# Patient Record
Sex: Female | Born: 1985
Health system: Southern US, Community
[De-identification: ages and names within clinical notes are randomized; demographics above are authoritative.]

## PROBLEM LIST (undated history)

## (undated) DIAGNOSIS — T7840XA Allergy, unspecified, initial encounter: Secondary | ICD-10-CM

## (undated) DIAGNOSIS — J189 Pneumonia, unspecified organism: Secondary | ICD-10-CM

## (undated) DIAGNOSIS — D699 Hemorrhagic condition, unspecified: Secondary | ICD-10-CM

## (undated) DIAGNOSIS — Z8719 Personal history of other diseases of the digestive system: Secondary | ICD-10-CM

## (undated) DIAGNOSIS — D649 Anemia, unspecified: Secondary | ICD-10-CM

## (undated) HISTORY — DX: Anemia, unspecified: D64.9

## (undated) HISTORY — DX: Allergy, unspecified, initial encounter: T78.40XA

## (undated) HISTORY — DX: Personal history of other diseases of the digestive system: Z87.19

## (undated) HISTORY — DX: Hemorrhagic condition, unspecified: D69.9

## (undated) HISTORY — DX: Pneumonia, unspecified organism: J18.9

## (undated) HISTORY — PX: TONSILLECTOMY: SUR1361

## (undated) HISTORY — PX: WISDOM TOOTH EXTRACTION: SHX21

---
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
## 2011-02-22 ENCOUNTER — Ambulatory Visit (INDEPENDENT_AMBULATORY_CARE_PROVIDER_SITE_OTHER): Payer: BC Managed Care – PPO | Admitting: Family Medicine

## 2011-02-22 ENCOUNTER — Encounter: Payer: Self-pay | Admitting: Family Medicine

## 2011-02-22 DIAGNOSIS — D699 Hemorrhagic condition, unspecified: Secondary | ICD-10-CM

## 2011-02-22 DIAGNOSIS — Z711 Person with feared health complaint in whom no diagnosis is made: Secondary | ICD-10-CM

## 2011-02-22 NOTE — Progress Notes (Signed)
  Subjective:    Patient ID: Veronica Kane, female    DOB: December 17, 1985, 25 y.o.   MRN: [id number]  HPI Has just come off OC's, and thinks she ovulated.  Interested in getting pregnant.  Has h/o free bleeding after surgery and easy bruising. Has seen Hematologist in [facility] and had w/u for vWB and Vit. K def.  No diagnosis reached.  Does have some h/o of prolonged PTT. Reports painful cycle hx with assoc. Vomiting and concern for endometriosis.  Her mother had that diagnosis and had complete hyst. At age 57. She is not on folic acid at present.   Review of Systems Negative for fever, chills, nausea, vomiting, CP. SOB.  Has h/o migraine headaches with cycles.  Reports abdominal bloating and painful intercourse Since stopping her OC's.      Objective:   Physical Exam  Alert and oriented. VS reviewed HEENT-[place]/AT, sclera w/o icterus Neck-supple Lungs-nml resp. Effort CV-Reg Abdomen-soft, NT. Ext-no C/C/E Skin-no rash      Assessment & Plan:  Preconception counseling ? Bleeding diathesis Bloating of unclear etiology.

## 2011-02-22 NOTE — Patient Instructions (Signed)
Preparing for Pregnancy Preparing for pregnancy (preconceptual care) by getting counseling and information from your caregiver before getting pregnant is a good idea. It will help you and your baby have a better chance to have a healthy, safe pregnancy and delivery of your baby. Make an appointment with your caregiver to talk about your health, medical and family history and how to prepare yourself before getting pregnant. Your caregiver will do a complete physical exam and a Pap test. They will want to know:  About you, your spouse/partner and your family's medical and genetic history.   If you are eating a balanced diet and drinking enough fluids.   What vitamins and mineral supplements you are taking. This includes taking folic acid before getting pregnant to help prevent birth defects.   What medications you are taking including prescription, over-the-counter and herbal medications.   If there is any substance abuse like alcohol, smoking and illegal drugs.   If there is any mental or physical domestic violence.   If there is any risk of sexually transmitted disease between you and your partner.   What immunizations and vaccinations you have had and what you may need before getting pregnant.   If you should get tested for HIV infection.   If there is any exposure to chemical or toxic substances at home or work.   If there are medical problems you have that need to be treated and kept under control before getting pregnant such as diabetes, high blood pressure or others.   If there were any past surgeries, pregnancies and problems with them.   What your current weight is and to set a goal as to how much weight you should gain while pregnant. Also, they will check if you should lose or gain weight before getting pregnant.   What is your exercise routine and what it is safe when you are pregnant.   If there are any physical disabilities that need to be addressed.   About spacing your  pregnancies when there are other children.   If there is a financial problem that may affect you having a child.  After talking about the above points with your caregiver, your caregiver will give you advice on how to help treat and work with you on solving any issues, if necessary, before getting pregnant. The goal is to have a healthy and safe pregnancy for you and your baby. You should keep an accurate record of your menstrual periods because it will help in determining your due date. Immunizations that you should have before getting pregnant:   Regular measles, German measles (rubella) and mumps.  Tetanus and diphtheria.   Chicken pox, if not immune.   Herpes zoster (Varicella) if not immune.  Human papilloma virus vaccine (HPV) between the age of 9 and 26 years old).   Hepatitis A vaccine.  Hepatitis B vaccine.   Influenza vaccine.   Pneumococcal vaccine (pneumonia).   You should avoid getting pregnant for one month after getting vaccinated with a live virus vaccine such as German measles (rubella) vaccine. Other immunizations may be necessary depending on where you live, such as malaria. Ask your caregiver if any other immunizations are needed for you. HOME CARE INSTRUCTIONS  Follow the advice of your caregiver.   Before getting pregnant:   Begin taking vitamins, supplements and folic acid 0.4 milligrams/day.   Get your immunizations up to date.   Get help from a nutrition counselor if you do not understand what a balanced diet   is, need help with a special medical diet or if you need help to lose or gain weight.   Begin exercising.   Stop smoking, taking illegal drugs and drinking alcoholic beverages.   Get counseling if there is and type of domestic violence.   Get checked for sexually transmitted diseases including HIV.   Get any medical problems under control (diabetes, high blood pressure, convulsions, asthma or others).   Resolve any financial concerns.   Be  sure you and your spouse/partner are ready to have a baby.   Keep an accurate record of your menstrual periods.  Document Released: 05/19/2008  [employer] Patient Information 2011 [employer]. 

## 2011-05-18 ENCOUNTER — Ambulatory Visit: Payer: BC Managed Care – PPO | Admitting: Gynecology

## 2011-05-18 DIAGNOSIS — O3680X0 Pregnancy with inconclusive fetal viability, not applicable or unspecified: Secondary | ICD-10-CM

## 2011-05-18 DIAGNOSIS — Z3400 Encounter for supervision of normal first pregnancy, unspecified trimester: Secondary | ICD-10-CM

## 2011-05-19 LAB — OBSTETRIC PANEL
Basophils Absolute: 0 10*3/uL (ref 0.0–0.1)
Basophils Relative: 0 % (ref 0–1)
Eosinophils Absolute: 0 10*3/uL (ref 0.0–0.7)
Eosinophils Relative: 0 % (ref 0–5)
Hepatitis B Surface Ag: NEGATIVE
Lymphs Abs: 1.4 10*3/uL (ref 0.7–4.0)
MCH: 31.3 pg (ref 26.0–34.0)
Neutrophils Relative %: 74 % (ref 43–77)
Platelets: 251 10*3/uL (ref 150–400)
RBC: 4.54 MIL/uL (ref 3.87–5.11)
RDW: 12.7 % (ref 11.5–15.5)
WBC: 6.8 10*3/uL (ref 4.0–10.5)

## 2011-05-19 LAB — HIV ANTIBODY (ROUTINE TESTING W REFLEX): HIV: NONREACTIVE

## 2011-05-20 LAB — CULTURE, URINE COMPREHENSIVE
Colony Count: NO GROWTH
Organism ID, Bacteria: NO GROWTH

## 2011-05-23 ENCOUNTER — Other Ambulatory Visit: Payer: BC Managed Care – PPO

## 2011-05-23 NOTE — Progress Notes (Signed)
Patient is here today for heart beat check.  She had some very slight bleeding over the weekend after intercourse.  Positive fetal heart rate on bedside ultrasound and fetal pole measures 6 weeks and 6 days.  Patient is reassured and will follow up for her appointment with the physician as scheduled.

## 2011-05-25 ENCOUNTER — Ambulatory Visit: Payer: BC Managed Care – PPO | Admitting: Family Medicine

## 2011-05-25 ENCOUNTER — Encounter: Payer: Self-pay | Admitting: Family Medicine

## 2011-05-25 DIAGNOSIS — O209 Hemorrhage in early pregnancy, unspecified: Secondary | ICD-10-CM | POA: Insufficient documentation

## 2011-05-25 DIAGNOSIS — D699 Hemorrhagic condition, unspecified: Secondary | ICD-10-CM | POA: Insufficient documentation

## 2011-05-25 DIAGNOSIS — Z3400 Encounter for supervision of normal first pregnancy, unspecified trimester: Secondary | ICD-10-CM | POA: Insufficient documentation

## 2011-05-25 NOTE — Patient Instructions (Signed)
Threatened Miscarriage  Bleeding during the first 20 weeks of pregnancy is common. This is sometimes called a threatened miscarriage. This is a pregnancy that is threatening to end before the twentieth week of pregnancy. Often this bleeding stops with bed rest or decreased activities as suggested by your caregiver and the pregnancy continues without any more problems. You may be asked to not have sexual intercourse, have orgasms or use tampons until further notice. Sometimes a threatened miscarriage can progress to a complete or incomplete miscarriage. This may or may not require further treatment. Some miscarriages occur before a woman misses a menstrual period and knows she is pregnant.  Miscarriages occur in 15 to 20% of all pregnancies and usually occur during the first 13 weeks of the pregnancy. The exact cause of a miscarriage is usually never known. A miscarriage is natures way of ending a pregnancy that is abnormal or would not make it to term. There are some things that may put you at risk to have a miscarriage, such as:   Hormone problems.   Infection of the uterus or cervix.   Chronic illness, diabetes for example, especially if it is not controlled.   Abnormal shaped uterus.   Fibroids in the uterus.   Incompetent cervix (the cervix is too weak to hold the baby).   Smoking.   Drinking too much alcohol. It's best not to drink any alcohol when you are pregnant.   Taking illegal drugs.  TREATMENT   When a miscarriage becomes complete and all products of conception (all the tissue in the uterus) have been passed, often no treatment is needed. If you think you passed tissue, save it in a container and take it to your doctor for evaluation. If the miscarriage is incomplete (parts of the fetus or placenta remain in the uterus), further treatment may be needed. The most common reason for further treatment is continued bleeding (hemorrhage) because pregnancy tissue did not pass out of the uterus. This  often occurs if a miscarriage is incomplete. Tissue left behind may also become infected. Treatment usually is dilatation and curettage (the removal of the remaining products of pregnancy. This can be done by a simple sucking procedure (suction curettage) or a simple scraping of the inside of the uterus. This may be done in the hospital or in the caregiver's office. This is only done when your caregiver knows that there is no chance for the pregnancy to proceed to term. This is determined by physical examination, negative pregnancy test, falling pregnancy hormone count and/or, an ultrasound revealing a dead fetus.  Miscarriages are often a very emotional time for prospective mothers and fathers. This is not you or your partners fault. It did not occur because of an inadequacy in you or your partner. Nearly all miscarriages occur because the pregnancy has started off wrongly. At least half of these pregnancies have a chromosomal abnormality. It is almost always not inherited. Others may have developmental problems with the fetus or placenta. This does not always show up even when the products miscarried are studied under the microscope. The miscarriage is nearly always not your fault and it is not likely that you could have prevented it from happening. If you are having emotional and grieving problems, talk to your health care provider and even seek counseling, if necessary, before getting pregnant again. You can begin trying for another pregnancy as soon as your caregiver says it is OK.  HOME CARE INSTRUCTIONS    Your caregiver may order   and cramping you are having. You may be limited to only getting up to go to the bathroom. You may be allowed to continue light activity. You may need to make arrangements for the care of your other children and for any other responsibilities.   Keep track of the number of pads you use each day, how often you have to change pads  and how saturated (soaked) they are. Record this information.   DO NOT USE TAMPONS. Do not douche, have sexual intercourse or orgasms until approved by your caregiver.   You may receive a follow up appointment for re-evaluation of your pregnancy and a repeat blood test. Re-evaluation often occurs after 2 days and again in 4 to 6 weeks. It is very important that you follow-up in the recommended time period.   If you are Rh negative and the father is Rh positive or you do not know the fathers' blood type, you may receive a shot (Rh immune globulin) to help prevent abnormal antibodies that can develop and affect the baby in any future pregnancies.  SEEK IMMEDIATE MEDICAL CARE IF:  You have severe cramps in your stomach, back, or abdomen.   You have a sudden onset of severe pain in the lower part of your abdomen.   You develop chills.   You run an unexplained temperature of 101 F (38.3 C) or higher.   You pass large clots or tissue. Save any tissue for your caregiver to inspect.   Your bleeding increases or you become light-headed, weak, or have fainting episodes.   You have a gush of fluid from your vagina.   You pass out. This could mean you have a tubal (ectopic) pregnancy.  Document Released: 06/06/2005 Document Revised: 02/16/2011 Document Reviewed: 01/21/2008 [facility] Patient Information 2012 [place], [place].

## 2011-05-25 NOTE — Progress Notes (Signed)
Patient is here today because she has been having spotting.  She is also taking DHA supplament and has a "tendency toward [place]"  She had heard that DHA might not be good to take with this disorder.  She is very concerned about the increase in spotting.  We did see a positive fetal heart rate on 11/28 and 12/5.  She also has a little cramping now which is new as of today.  She is also feeling increased fatigue over the past two days as well.  Her urine dip was wnl except for blood in urine.

## 2011-05-25 NOTE — Progress Notes (Signed)
Here today for bleeding during pregnancy.  Is having bleeding wonders if it is related to DHA.  She was worked in.  TVUS reveals a single IUP with nml FHR, yolk sac, CRL = 7w 3d. Threatened AB discussed with pt.  Will need pap/pelvic full new OB visit at next visit.

## 2011-05-27 ENCOUNTER — Other Ambulatory Visit: Payer: BC Managed Care – PPO

## 2011-05-27 NOTE — Progress Notes (Signed)
Patient comes today to confirm fetal heart rate due to continued bleeding that she is experiencing.  Bedside ultrasound shows proper growth and positive fetal heart rate.  She is reassured and will go to [facility] if her symptoms change or worsen. She will keep her regular scheduled appointment.

## 2011-06-01 ENCOUNTER — Other Ambulatory Visit: Payer: BC Managed Care – PPO | Admitting: *Deleted

## 2011-06-01 NOTE — Progress Notes (Signed)
Patient is here today for heartbeat check.  Positive fetal heart rate on ultrasound.  Patient is reassured/tn

## 2011-06-22 ENCOUNTER — Ambulatory Visit (HOSPITAL_COMMUNITY): Payer: BC Managed Care – PPO

## 2011-06-27 ENCOUNTER — Encounter: Payer: BC Managed Care – PPO | Admitting: Obstetrics & Gynecology

## 2011-12-13 ENCOUNTER — Encounter: Payer: Self-pay | Admitting: Family Medicine

## 2011-12-26 ENCOUNTER — Telehealth (HOSPITAL_COMMUNITY): Payer: Self-pay | Admitting: *Deleted

## 2011-12-26 NOTE — Telephone Encounter (Signed)
Preadmission screen  

## 2014-04-21 ENCOUNTER — Encounter: Payer: Self-pay | Admitting: Family Medicine

## 2015-01-01 ENCOUNTER — Ambulatory Visit (INDEPENDENT_AMBULATORY_CARE_PROVIDER_SITE_OTHER): Payer: BLUE CROSS/BLUE SHIELD | Admitting: Family Medicine

## 2015-01-01 ENCOUNTER — Encounter (INDEPENDENT_AMBULATORY_CARE_PROVIDER_SITE_OTHER): Payer: Self-pay

## 2015-01-01 ENCOUNTER — Encounter: Payer: Self-pay | Admitting: Family Medicine

## 2015-01-01 VITALS — BP 108/60 | HR 74 | Temp 97.8°F | Ht 64.0 in | Wt 132.5 lb

## 2015-01-01 DIAGNOSIS — D689 Coagulation defect, unspecified: Secondary | ICD-10-CM

## 2015-01-01 DIAGNOSIS — J3089 Other allergic rhinitis: Secondary | ICD-10-CM

## 2015-01-01 DIAGNOSIS — J309 Allergic rhinitis, unspecified: Secondary | ICD-10-CM | POA: Insufficient documentation

## 2015-01-01 DIAGNOSIS — D699 Hemorrhagic condition, unspecified: Secondary | ICD-10-CM

## 2015-01-01 NOTE — Patient Instructions (Addendum)
Scheduled CPX, pap no labs prior in next few months.

## 2015-01-01 NOTE — Progress Notes (Signed)
   Subjective:    Patient ID: Veronica Kane, female    DOB: Nov 05, 1985, 29 y.o.   MRN: [id number]  HPI  29 year old female presents to establish care. Last CPX 3 years ago, due for pap now. Had baby four months ago with GYN. She would like to do this here.   At age 38 was tested for bleeding disorder after large contusion. Irregular PTT.. No specific issues noted per hematologist but likely has some mild bleeding disorder. Told should be no issue except avoid aspirin. She did have post delivery bledeing but likely due to given motrin.  No hemorrhage with second child.  Exercise: walks every few days Diet: Healthy, fruits and veggies, water  Review of Systems  Constitutional: Negative for fever and fatigue.  HENT: Negative for congestion.   Eyes: Negative for pain.  Respiratory: Negative for cough and shortness of breath.   Cardiovascular: Negative for chest pain, palpitations and leg swelling.  Gastrointestinal: Negative for abdominal pain.  Genitourinary: Negative for dysuria and vaginal bleeding.  Neurological: Negative for syncope, light-headedness and headaches.  Psychiatric/Behavioral: Negative for dysphoric mood.       Objective:   Physical Exam  Constitutional: Vital signs are normal. She appears well-developed and well-nourished. She is cooperative.  Non-toxic appearance. She does not appear ill. No distress.  HENT:  Head: Normocephalic.  Right Ear: Hearing, tympanic membrane, external ear and ear canal normal.  Left Ear: Hearing, tympanic membrane, external ear and ear canal normal.  Nose: Nose normal.  Eyes: Conjunctivae, EOM and lids are normal. Pupils are equal, round, and reactive to light. Lids are everted and swept, no foreign bodies found.  Neck: Trachea normal and normal range of motion. Neck supple. Carotid bruit is not present. No thyroid mass and no thyromegaly present.  Cardiovascular: Normal rate, regular rhythm, S1 normal, S2 normal, normal heart  sounds and intact distal pulses.  Exam reveals no gallop.   No murmur heard. Pulmonary/Chest: Effort normal and breath sounds normal. No respiratory distress. She has no wheezes. She has no rhonchi. She has no rales.  Abdominal: Soft. Normal appearance and bowel sounds are normal. She exhibits no distension, no fluid wave, no abdominal bruit and no mass. There is no hepatosplenomegaly. There is no tenderness. There is no rebound, no guarding and no CVA tenderness. No hernia.  Lymphadenopathy:    She has no cervical adenopathy.    She has no axillary adenopathy.  Neurological: She is alert. She has normal strength. No cranial nerve deficit or sensory deficit.  Skin: Skin is warm, dry and intact. No rash noted.  Psychiatric: Her speech is normal and behavior is normal. Judgment normal. Her mood appears not anxious. Cognition and memory are normal. She does not exhibit a depressed mood.          Assessment & Plan:

## 2015-01-01 NOTE — Progress Notes (Signed)
Pre visit review using our clinic review tool, if applicable. No additional management support is needed unless otherwise documented below in the visit note. 

## 2015-01-01 NOTE — Assessment & Plan Note (Signed)
No ASA no NSAIDs.

## 2015-01-01 NOTE — Assessment & Plan Note (Signed)
Stable control on prn meds.

## 2015-07-14 ENCOUNTER — Ambulatory Visit (INDEPENDENT_AMBULATORY_CARE_PROVIDER_SITE_OTHER): Payer: BLUE CROSS/BLUE SHIELD | Admitting: Internal Medicine

## 2015-07-14 ENCOUNTER — Encounter: Payer: Self-pay | Admitting: Internal Medicine

## 2015-07-14 ENCOUNTER — Ambulatory Visit (INDEPENDENT_AMBULATORY_CARE_PROVIDER_SITE_OTHER)
Admission: RE | Admit: 2015-07-14 | Discharge: 2015-07-14 | Disposition: A | Discharge status: Home / Self Care | Payer: BLUE CROSS/BLUE SHIELD | Source: Ambulatory Visit | Attending: Internal Medicine | Admitting: Internal Medicine

## 2015-07-14 VITALS — BP 102/68 | HR 71 | Temp 99.0°F | Wt 122.0 lb

## 2015-07-14 DIAGNOSIS — S99921A Unspecified injury of right foot, initial encounter: Secondary | ICD-10-CM | POA: Diagnosis not present

## 2015-07-14 NOTE — Progress Notes (Signed)
Pre visit review using our clinic review tool, if applicable. No additional management support is needed unless otherwise documented below in the visit note. 

## 2015-07-14 NOTE — Patient Instructions (Signed)
Toe Fracture With Rehab  A fracture is a break in the bone that can be either partial or complete. Fractures of the toe bones may or may not include the joints that separate the bones.  SYMPTOMS   · Severe pain over the fracture site at the time of injury that may persist for an extend period of time.  · Pain, tenderness, inflammation, and/or bruising (contusion) over the fracture site.  · Visible deformity, if the bone fragments are not properly aligned (displaced fracture).  · Signs of vascular damage: numbness or coldness (uncommon).  CAUSES   Toe fractures occur when a force is placed on the bone that is greater than it can withstand.  · Direct hit (trauma) to the toe.  · Indirect trauma to the toe, such as forcefully pivoting on a planted foot.  RISK INCREASES WITH:  · Performing activities barefoot (i.e. ballet, gymnastics).  · Wearing shoes with little support or protection.  · Sports with cleats (i.e. football, rugby, lacrosse, soccer).  · Bone disease (i.e. osteoporosis, bone tumors).  PREVENTION   · Wear properly fitted and protective shoes.  · Protect previously injured toes with tape or padding.  PROGNOSIS   If treated properly, toe fractures usually heal within 4 to 6 weeks.  RELATED COMPLICATIONS   · Failure of the fracture to heal (nonunion).  · Healing of the fracture in a poor position (malunion).  · Recurring symptoms.  · Recurring symptoms that result in a chronic problem.  · Excessive bleeding, causing pressure on nerves and blood vessels (rare).  · Arthritis of the affected joints.  · Stopping of bone growth in children.  · Infection in fractures where the skin is broken over the fracture (open fracture).  · Shortening of injured bones.  TREATMENT   Treatment first involves the use of ice and medicine to reduce pain and inflammation. The toe should be restrained for a period of time to allow for healing, usually about 4 weeks. Your caregiver may advise wearing a hard-soled shoe to minimize  stress on the healing bone. Surgery is uncommon for this injury, but may be necessary if the fracture is severely displaced or if the bone pushes through the skin. Surgery typically involves the use of screws, pins, and/or plates to hold the fracture in place. After surgery, restraint of the foot is necessary.  MEDICATION   · If pain medicine is necessary, nonsteroidal anti-inflammatory medications (aspirin and ibuprofen), or other minor pain relievers (acetaminophen), are often recommended.  · Do not take pain medicine for 7 days before surgery.  · Prescription pain relievers may be given if your caregiver thinks they are needed. Use only as directed and only as much as you need.  COLD THERAPY   Cold treatment (icing) relieves pain and reduces inflammation. Cold treatment should be applied for 10 to 15 minutes every 2 to 3 hours, and immediately after activity that aggravates your symptoms. Use ice packs or an ice massage.  SEEK MEDICAL CARE IF:   · Treatment does not seem to help, or the condition gets worse.  · Any medicines produce negative side effects.  · Any complications from surgery occur:    Pain, numbness, or coldness in the affected foot.    Discoloration beneath the toenails (blue or gray) of the affected foot.    Signs of infection (fever, pain, inflammation, redness, or persistent bleeding).  EXERCISES  RANGE OF MOTION (ROM) AND STRETCHING EXERCISES - Toe Fracture (Phalangeal)  These exercises may   help you when beginning to rehabilitate your injury. Your symptoms may resolve with or without further involvement from your physician, physical therapist or athletic trainer. While completing these exercises, remember:   · Restoring tissue flexibility helps normal motion to return to the joints. This allows healthier, less painful movement and activity.  · An effective stretch should be held for at least 30 seconds.  · A stretch should never be painful. You should only feel a gentle lengthening or release  in the stretched tissue.  RANGE OF MOTION - Dorsi/Plantar Flexion  · While sitting with your right / left knee straight, draw the top of your foot upwards by flexing your ankle. Then reverse the motion, pointing your toes downward.  · Hold each position for __________ seconds.  · After completing your first set of exercises, repeat this exercise with your knee bent.  Repeat __________ times. Complete this exercise __________ times per day.   RANGE OF MOTION - Ankle Alphabet  Imagine your right / left big toe is a pen.  Keeping your hip and knee still, write out the entire alphabet with your "pen." Make the letters as large as you can without increasing any discomfort.  Repeat __________ times. Complete this exercise __________ times per day.   RANGE OF MOTION - Toe Extension, Flexion  · Sit with your right / left leg crossed over your opposite knee.  · Grasp your toes and gently pull them back toward the top of your foot. You should feel a stretch on the bottom of your toes and foot.  · Hold this stretch for __________ seconds.  · Now, gently pull your toes toward the bottom of your foot. You should feel a stretch on the top of your toes and foot.  · Hold this stretch for __________ seconds.  Repeat __________ times. Complete this stretch__________ times per day.   STRENGTHENING EXERCISES - Toe Fracture (Phalangeal)  These exercises may help you when beginning to rehabilitate your injury. They may resolve your symptoms with or without further involvement from your physician, physical therapist or athletic trainer. While completing these exercises, remember:   · Muscles can gain both the endurance and the strength needed for everyday activities through controlled exercises.  · Complete these exercises as instructed by your physician, physical therapist or athletic trainer. Increase the resistance and repetitions only as guided.  · You may experience muscle soreness or fatigue, but the pain or discomfort you are  trying to eliminate should never worsen during these exercises. If this pain does get worse, stop and make sure you are following the directions exactly. If the pain is still present after adjustments, discontinue the exercise until you can discuss the trouble with your clinician.  STRENGTH - Towel Curls  · Sit in a chair, on a non-carpeted surface.  · Place your foot on a towel, keeping your heel on the floor.  · Pull the towel toward your heel only by curling your toes. Keep your heel on the floor.  · If instructed by your physician, physical therapist or athletic trainer, add ____________________ at the end of the towel.  Repeat __________ times. Complete this exercise __________ times per day.     This information is not intended to replace advice given to you by your health care provider. Make sure you discuss any questions you have with your health care provider.     Document Released: 06/06/2005 Document Revised: 10/21/2014 Document Reviewed: 09/18/2008  [employer] Interactive Patient Education ©2016 [employer]

## 2015-07-14 NOTE — Progress Notes (Signed)
Subjective:    Patient ID: Veronica Kane, female    DOB: September 28, 1985, 28 y.o.   MRN: [id number]  HPI   Pt presents to the clinic today with c/o right great toe injury. This occurred last night. She dropped a large stainless steel pot on the base of the toe. The pain is constantly achy, and severe with movement or to touch. The pain extends midway up dorsal aspect of right foot. She has noticed a lump at the base of her great toe. She has not tried OTC pain relievers or ice. Patient is breastfeeding and states she can deal with the pain rather than taking medication.    Review of Systems  Past Medical History  Diagnosis Date  . Allergy     Current Outpatient Prescriptions  Medication Sig Dispense Refill  . HEATHER 0.35 MG tablet Take 1 tablet by mouth daily.      No current facility-administered medications for this visit.    Allergies  Allergen Reactions  . Aspirin Other (See Comments)    Pt ha hx of anemia.  Marland Kitchen Penicillins Hives    Family History  Problem Relation Age of Onset  . Heart disease Father   . Hypertension Father   . Alcohol abuse Maternal Grandfather   . Alcohol abuse Paternal Grandfather     Social History   Social History  . Marital Status: Unknown    Spouse Name: N/A  . Number of Children: N/A  . Years of Education: N/A   Occupational History  . Not on file.   Social History Main Topics  . Smoking status: Never Smoker   . Smokeless tobacco: Never Used  . Alcohol Use: 0.0 oz/week    0 Standard drinks or equivalent per week     Comment: Occasional  . Drug Use: No  . Sexual Activity: Yes    Birth Control/ Protection: Pill   Other Topics Concern  . Not on file   Social History Narrative    Skin: Positive for bruising and swelling at the base of the right great toe.  Musculoskeletal: Positive for pain at the base of the right great toe extending midway up dorsal aspect of her right foot. Denies difficulty with gait or muscle  pain. Neurological: Denies loss of sensation in right great toe and right foot.    No other specific complaints in a complete review of systems (except as listed in HPI above).     Objective:   Physical Exam  BP 102/68 mmHg  Pulse 71  Temp(Src) 99 F (37.2 C) (Oral)  Wt 122 lb (55.339 kg)  SpO2 98% Wt Readings from Last 3 Encounters:  07/14/15 122 lb (55.339 kg)  01/01/15 132 lb 8 oz (60.102 kg)    General: Appears her stated age,  in NAD. Skin: Warm, dry and intact. Bruising over base of right great toe and extending midway up right dorsal side of foot.  Cardio: Dorsalis pedis pulse 2+, right LE.  Musculoskeletal: Pain to palpation in right great toe extending up foot past proximal intertarsal joint line. Decreased AROM in right great toe, normal PROM with pain and greatly increased pain with any lateral movement of great toe. Mild swelling over first proximal intertarsal joint. No difficulty with gait.  Neurological: Sensation in tact in right foot and toes.        Assessment & Plan:  Right great toe injury:  Xray of right great toe ordered Pt instructed to use ice for pain PRN Buddy taped  toe for comfort Will call patient with radiology results Suggest post-op shoe if fracture in great toe, referral to ortho if fracture in tarsal bone  RTC as needed, or if pain worsens/does not get better as anticipated

## 2015-12-11 ENCOUNTER — Other Ambulatory Visit: Payer: Self-pay

## 2015-12-11 MED ORDER — HEATHER 0.35 MG PO TABS: 1.0000 | ORAL_TABLET | Freq: Every day | ORAL | Status: DC

## 2015-12-11 MED ORDER — NORETHINDRONE 0.35 MG PO TABS: 1.0000 | ORAL_TABLET | Freq: Every day | ORAL | Status: DC

## 2015-12-11 NOTE — Telephone Encounter (Signed)
Veronica Kane notified 1 pack of birth control pills have been sent to her pharmacy.  Reminded her that she will need to schedule CPE with Dr. Diona Browner prior to any additional refills.

## 2015-12-11 NOTE — Telephone Encounter (Signed)
Pt established care 01/01/15; pt has not been seen for CPX yet; pt has not received BC pill from [facility] (now pt thinks she is out of refills). Med prescribed by OB GYN at [facility] March or April of 2016 at 6 wk check up after delivery of child. Pt needs to start [facility] pill today. Wants to know if [facility] can be refilled until can schedule appt with Dr Diona Browner for CPX. Advised pt Dr Diona Browner is out of office but will send note; also suggested to pt to ck with prescribing physician to see if will give refill on BC pill. Pt request cb. [facility].

## 2015-12-11 NOTE — Addendum Note (Signed)
Addended by: Carter Kitten on: 12/11/2015 04:31 PM   Modules accepted: Orders

## 2015-12-11 NOTE — Telephone Encounter (Signed)
Generic Rx sent electronically.

## 2015-12-11 NOTE — Telephone Encounter (Signed)
Veronica Kane with [facility] left v/m; does not have namebrand Veronica Kane 0.35 mg but [facility] same generic active hormone with no estrogen in it. Veronica Kane has spoken with pt and pt is OK with changing from [employer]. Veronica Kane request cb or can send electronically.

## 2015-12-31 ENCOUNTER — Other Ambulatory Visit: Payer: Self-pay

## 2015-12-31 ENCOUNTER — Ambulatory Visit (INDEPENDENT_AMBULATORY_CARE_PROVIDER_SITE_OTHER): Payer: BLUE CROSS/BLUE SHIELD | Admitting: Family Medicine

## 2015-12-31 ENCOUNTER — Other Ambulatory Visit (HOSPITAL_COMMUNITY)
Admission: RE | Admit: 2015-12-31 | Discharge: 2015-12-31 | Disposition: A | Discharge status: Home / Self Care | Payer: BLUE CROSS/BLUE SHIELD | Source: Ambulatory Visit | Attending: Family Medicine | Admitting: Family Medicine

## 2015-12-31 ENCOUNTER — Encounter: Payer: Self-pay | Admitting: Family Medicine

## 2015-12-31 VITALS — BP 100/52 | HR 84 | Ht 63.5 in | Wt 124.8 lb

## 2015-12-31 DIAGNOSIS — Z0000 Encounter for general adult medical examination without abnormal findings: Secondary | ICD-10-CM | POA: Diagnosis not present

## 2015-12-31 DIAGNOSIS — Z1151 Encounter for screening for human papillomavirus (HPV): Secondary | ICD-10-CM | POA: Diagnosis present

## 2015-12-31 DIAGNOSIS — Z01419 Encounter for gynecological examination (general) (routine) without abnormal findings: Secondary | ICD-10-CM | POA: Insufficient documentation

## 2015-12-31 MED ORDER — NORETHINDRONE 0.35 MG PO TABS: 1.0000 | ORAL_TABLET | Freq: Every day | ORAL | Status: DC

## 2015-12-31 NOTE — Progress Notes (Signed)
Subjective:    Patient ID: Veronica Kane, female    DOB: 08/24/1985, 30 y.o.   MRN: [id number]  HPI   The patient is here for annual wellness exam and preventative care.    She feels well overall. She has not had a menses since prior to having her child. 37 month old. She is breast feeding. She is using progesterone pill. No PMS, no breast ache, no cramping  As if she is going to have menses.      She has been feeling lightheaded today, some tired lately. She drinks 4-5 bottles a day.  No syyncope BP Readings from Last 3 Encounters:  12/31/15 100/52  07/14/15 102/68  01/01/15 108/60     At age 53 was tested for bleeding disorder after large contusion. Irregular PTT.. No specific issues noted per hematologist but likely has some mild bleeding disorder. Told should be no issue except avoid aspirin. She did have post delivery bledeing but likely due to given motrin. No hemorrhage with second child.  Exercise: walks every few days Diet: Healthy, fruits and veggies, water  Social History /Family History/Past Medical History reviewed and updated if needed.  Review of Systems  Constitutional: Negative for fever and fatigue.  HENT: Negative for congestion and ear pain.   Eyes: Negative for pain.  Respiratory: Negative for cough, chest tightness and shortness of breath.   Cardiovascular: Negative for chest pain, palpitations and leg swelling.  Gastrointestinal: Negative for abdominal pain.  Genitourinary: Negative for dysuria and vaginal bleeding.  Musculoskeletal: Positive for back pain.  Neurological: Positive for dizziness. Negative for syncope, light-headedness and headaches.  Psychiatric/Behavioral: Negative for dysphoric mood.       Objective:   Physical Exam  Constitutional: Vital signs are normal. She appears well-developed and well-nourished. She is cooperative.  Non-toxic appearance. She does not appear ill. No distress.  HENT:  Head:  Normocephalic.  Right Ear: Hearing, tympanic membrane, external ear and ear canal normal.  Left Ear: Hearing, tympanic membrane, external ear and ear canal normal.  Nose: Nose normal.  Eyes: Conjunctivae, EOM and lids are normal. Pupils are equal, round, and reactive to light. Lids are everted and swept, no foreign bodies found.  Neck: Trachea normal and normal range of motion. Neck supple. Carotid bruit is not present. No thyroid mass and no thyromegaly present.  Cardiovascular: Normal rate, regular rhythm, S1 normal, S2 normal, normal heart sounds and intact distal pulses.  Exam reveals no gallop.   No murmur heard. Pulmonary/Chest: Effort normal and breath sounds normal. No respiratory distress. She has no wheezes. She has no rhonchi. She has no rales.  Abdominal: Soft. Normal appearance and bowel sounds are normal. She exhibits no distension, no fluid wave, no abdominal bruit and no mass. There is no hepatosplenomegaly. There is no tenderness. There is no rebound, no guarding and no CVA tenderness. No hernia.  Genitourinary: Vagina normal and uterus normal. No breast swelling, tenderness, discharge or bleeding. Pelvic exam was performed with patient supine. There is no rash, tenderness or lesion on the right labia. There is no rash, tenderness or lesion on the left labia. Uterus is not enlarged and not tender. Cervix exhibits no motion tenderness, no discharge and no friability. Right adnexum displays no mass, no tenderness and no fullness. Left adnexum displays no mass, no tenderness and no fullness.  Lymphadenopathy:    She has no cervical adenopathy.    She has no axillary adenopathy.  Neurological: She is alert. She has normal  strength. No cranial nerve deficit or sensory deficit.  Skin: Skin is warm, dry and intact. No rash noted.  Psychiatric: Her speech is normal and behavior is normal. Judgment normal. Her mood appears not anxious. Cognition and memory are normal. She does not exhibit a  depressed mood.          Assessment & Plan:  The patient's preventative maintenance and recommended screening tests for an annual wellness exam were reviewed in full today. Brought up to date unless services declined.  Counselled on the importance of diet, exercise, and its role in overall health and mortality. The patient's FH and SH was reviewed, including their home life, tobacco status, and drug and alcohol status.   Vaccines: Uptodate  Nonsmoker PAP/DVE: due for both today.  Mammo: no early family history of  breast cancer  Colon: no early family history of  colon cancer  HIV STD testing: refused

## 2015-12-31 NOTE — Addendum Note (Signed)
Addended by: Inocencio Homes on: 12/31/2015 11:25 AM   Modules accepted: Orders, SmartSet

## 2015-12-31 NOTE — Patient Instructions (Addendum)
Wean off breast feeding as able. Make appt once done breast feeding if interested in birth control change.  Return if dizziness continues.

## 2016-01-01 LAB — CYTOLOGY - PAP

## 2016-01-05 ENCOUNTER — Other Ambulatory Visit: Payer: Self-pay | Admitting: Family Medicine

## 2016-01-07 ENCOUNTER — Encounter: Payer: Self-pay | Admitting: *Deleted

## 2016-03-17 ENCOUNTER — Ambulatory Visit (INDEPENDENT_AMBULATORY_CARE_PROVIDER_SITE_OTHER): Payer: BLUE CROSS/BLUE SHIELD | Admitting: Family Medicine

## 2016-03-17 ENCOUNTER — Encounter: Payer: Self-pay | Admitting: Family Medicine

## 2016-03-17 VITALS — BP 92/60 | HR 88 | Temp 98.0°F | Wt 125.0 lb

## 2016-03-17 DIAGNOSIS — S060X0A Concussion without loss of consciousness, initial encounter: Secondary | ICD-10-CM

## 2016-03-17 DIAGNOSIS — S161XXA Strain of muscle, fascia and tendon at neck level, initial encounter: Secondary | ICD-10-CM | POA: Diagnosis not present

## 2016-03-17 MED ORDER — CYCLOBENZAPRINE HCL 10 MG PO TABS: ORAL_TABLET | ORAL | 0 refills | Status: DC

## 2016-03-17 NOTE — Progress Notes (Signed)
Subjective:    Patient ID: Veronica Kane, female    DOB: Mar 23, 1986, 30 y.o.   MRN: [id number]  HPI This is a 30 yo female who presents today following a fall that occurred 2 days ago. She has been in the process of moving. She was getting her daughter out of the car and is not sure if she fell or slipped, but she hit the right side of her forehead on the car door frame. Does not think she lost consciousness. Pain was severe and she laid down. Immediately after, she felt like a "noodle." She immediately had an area of swelling over her right eye and a mild headache. No laceration or bleeding. Slept well that night. Took some tylenol. The next day she felt slow to respond, light headed. Headache started to get worse, like a migraine. Had some dizziness last night. Pain felt like pressure on left side and under eyes. Currently no headache. Has had some neck stiffness and pain that is only relieved with ice, not tylenol. She is breastfeeding her 41 month old once at night. She also has a 28 yo.   No visual changes, no radiation of pain down back, no numbness, no tingling, no weakness, no falls, no abdominal pain, + nausea, no vomiting, no dysuria/hematuria/urinary frequency, no bowel or bladder changes.   Does not take NSAIDs due to history of bleeding diathesis- negative w/u for vWB and vitamin K def. No diagnosis reached. Has had prolonged PTT.  Past Medical History:  Diagnosis Date  . Allergy   . Anemia   . Asthma    AS A CHILD  . Bleeding diathesis ([place])   . History of constipation   . Pneumonia    AT AGE 21   Past Surgical History:  Procedure Laterality Date  . TONSILLECTOMY     at ge 4-5  . TONSILLECTOMY AND ADENOIDECTOMY  1994  . TYMPANOSTOMY TUBE PLACEMENT  1993   X 3  . WISDOM TOOTH EXTRACTION     Family History  Problem Relation Age of Onset  . Cancer Mother 37    uterine and ovarian / hysterectomy  . Hypertension Paternal Grandmother   . Cancer Other    BREAST CANCER  . Cancer Other 73    BREAST CANCER  . Heart disease Father   . Hypertension Father   . Alcohol abuse Maternal Grandfather   . Alcohol abuse Paternal Grandfather    Social History  Substance Use Topics  . Smoking status: Never Smoker  . Smokeless tobacco: Never Used  . Alcohol use 0.0 oz/week     Comment: OCCASSION      Review of Systems Per HPI    Objective:   Physical Exam  Constitutional: She is oriented to person, place, and time. She appears well-developed and well-nourished.  HENT:  Head: Normocephalic and atraumatic.  Right Ear: External ear normal.  Left Ear: External ear normal.  Nose: Nose normal.  Mouth/Throat: Oropharynx is clear and moist. No oropharyngeal exudate.  Area above right eye with small amount swelling, no ecchymosis, + TTP.   Eyes: Conjunctivae and EOM are normal. Pupils are equal, round, and reactive to light. Right eye exhibits no discharge. Left eye exhibits no discharge. No scleral icterus.  Neck: Normal range of motion. Neck supple. Muscular tenderness (right trapezius) present.  Cardiovascular: Normal rate, regular rhythm and normal heart sounds.   Pulmonary/Chest: Effort normal and breath sounds normal.  Abdominal: Soft. Bowel sounds are normal. She exhibits no distension.  There is no tenderness. There is no rebound and no guarding.  Musculoskeletal: Normal range of motion.  Neurological: She is alert and oriented to person, place, and time.  Cranial nerves: CN I: not tested CN II: pupils equal, round and reactive to light, visual fields intact CN III, IV, VI: full range of motion, no nystagmus, no ptosis CN V: facial sensation intact CN VII: upper and lower face symmetric CN VIII: hearing intact CN IX, X: gag intact, uvula midline CN XI: sternocleidomastoid and trapezius muscles intact CN XII: tongue midline Bulk & Tone: normal, no fasciculations. Motor: 5/5 throughout. Sensation: Sensation intact. Deep Tendon  Reflexes: 2+ throughout.  Finger to nose testing: Without dysmetria.  Heel to shin: Without dysmetria.  Gait: Normal station and stride.    Skin: Skin is warm and dry.  Psychiatric: She has a normal mood and affect. Her behavior is normal. Judgment and thought content normal.  Vitals reviewed.     BP 92/60   Pulse 88   Temp 98 F (36.7 C)   Wt 125 lb (56.7 kg)   LMP 02/08/2016   SpO2 96%   BMI 21.80 kg/m   Wt Readings from Last 3 Encounters:  03/17/16 125 lb (56.7 kg)  12/31/15 124 lb 12.8 oz (56.6 kg)  07/14/15 122 lb (55.3 kg)       Assessment & Plan:  1. Mild concussion, without loss of consciousness, initial encounter - Provided written and verbal information regarding diagnosis and treatment. - anticipatory guidance provided regarding symptoms- can take several weeks for them to resolve completely, suggested she keep a log of symptoms to monitor improvement or worsening - encouraged her to avoid screen time, excessive noise, getting overly fatigued - RTC precautions reviewed  2. Cervical strain, initial encounter - Provided written and verbal information regarding diagnosis and treatment. - can try heat, gentle ROM several times a day - cyclobenzaprine (FLEXERIL) 10 MG tablet; Take 1/2 to 1 tablet at bedtime as needed for neck spasm  Dispense: 30 tablet; Refill: 0   Clarene Reamer, FNP-BC  [facility]  03/17/2016 12:47 PM

## 2016-03-17 NOTE — Patient Instructions (Addendum)
Concussion, Adult  A concussion, or closed-head injury, is a brain injury caused by a direct blow to the head or by a quick and sudden movement (jolt) of the head or neck. Concussions are usually not life-threatening. Even so, the effects of a concussion can be serious. If you have had a concussion before, you are more likely to experience concussion-like symptoms after a direct blow to the head.   CAUSES   Direct blow to the head, such as from running into another player during a soccer game, being hit in a fight, or hitting your head on a hard surface.   A jolt of the head or neck that causes the brain to move back and forth inside the skull, such as in a car crash.  SIGNS AND SYMPTOMS  The signs of a concussion can be hard to notice. Early on, they may be missed by you, family members, and health care providers. You may look fine but act or feel differently.  Symptoms are usually temporary, but they may last for days, weeks, or even longer. Some symptoms may appear right away while others may not show up for hours or days. Every head injury is different. Symptoms include:   Mild to moderate headaches that will not go away.   A feeling of pressure inside your head.   Having more trouble than usual:    Learning or remembering things you have heard.    Answering questions.    Paying attention or concentrating.    Organizing daily tasks.    Making decisions and solving problems.   Slowness in thinking, acting or reacting, speaking, or reading.   Getting lost or being easily confused.   Feeling tired all the time or lacking energy (fatigued).   Feeling drowsy.   Sleep disturbances.    Sleeping more than usual.    Sleeping less than usual.    Trouble falling asleep.    Trouble sleeping (insomnia).   Loss of balance or feeling lightheaded or dizzy.   Nausea or vomiting.   Numbness or tingling.   Increased sensitivity to:    Sounds.    Lights.    Distractions.   Vision problems or eyes that tire  easily.   Diminished sense of taste or smell.   Ringing in the ears.   Mood changes such as feeling sad or anxious.   Becoming easily irritated or angry for little or no reason.   Lack of motivation.   Seeing or hearing things other people do not see or hear (hallucinations).  DIAGNOSIS  Your health care provider can usually diagnose a concussion based on a description of your injury and symptoms. He or she will ask whether you passed out (lost consciousness) and whether you are having trouble remembering events that happened right before and during your injury.  Your evaluation might include:   A brain scan to look for signs of injury to the brain. Even if the test shows no injury, you may still have a concussion.   Blood tests to be sure other problems are not present.  TREATMENT   Concussions are usually treated in an emergency department, in urgent care, or at a clinic. You may need to stay in the hospital overnight for further treatment.   Tell your health care provider if you are taking any medicines, including prescription medicines, over-the-counter medicines, and natural remedies. Some medicines, such as blood thinners (anticoagulants) and aspirin, may increase the chance of complications. Also tell your health care   your age, and how healthy you were before the concussion.  Most people with mild injuries recover fully. Recovery can take time. In general, recovery is slower in older persons. Also, persons who have had a concussion in the past or have other medical problems may find that it takes longer to recover from their current injury. HOME  CARE INSTRUCTIONS General Instructions  Carefully follow the directions your health care provider gave you.  Only take over-the-counter or prescription medicines for pain, discomfort, or fever as directed by your health care provider.  Take only those medicines that your health care provider has approved.  Do not drink alcohol until your health care provider says you are well enough to do so. Alcohol and certain other drugs may slow your recovery and can put you at risk of further injury.  If it is harder than usual to remember things, write them down.  If you are easily distracted, try to do one thing at a time. For example, do not try to watch TV while fixing dinner.  Talk with family members or close friends when making important decisions.  Keep all follow-up appointments. Repeated evaluation of your symptoms is recommended for your recovery.  Watch your symptoms and tell others to do the same. Complications sometimes occur after a concussion. Older adults with a brain injury may have a higher risk of serious complications, such as a blood clot on the brain.  Tell your teachers, school nurse, school counselor, coach, athletic trainer, or work Freight forwarder about your injury, symptoms, and restrictions. Tell them about what you can or cannot do. They should watch for:  Increased problems with attention or concentration.  Increased difficulty remembering or learning new information.  Increased time needed to complete tasks or assignments.  Increased irritability or decreased ability to cope with stress.  Increased symptoms.  Rest. Rest helps the brain to heal. Make sure you:  Get plenty of sleep at night. Avoid staying up late at night.  Keep the same bedtime hours on weekends and weekdays.  Rest during the day. Take daytime naps or rest breaks when you feel tired.  Limit activities that require a lot of thought or concentration. These include:  Doing homework or job-related  work.  Watching TV.  Working on the computer.  Avoid any situation where there is potential for another head injury (football, hockey, soccer, basketball, martial arts, downhill snow sports and horseback riding). Your condition will get worse every time you experience a concussion. You should avoid these activities until you are evaluated by the appropriate follow-up health care providers. Returning To Your Regular Activities You will need to return to your normal activities slowly, not all at once. You must give your body and brain enough time for recovery.  Do not return to sports or other athletic activities until your health care provider tells you it is safe to do so.  Ask your health care provider when you can drive, ride a bicycle, or operate heavy machinery. Your ability to react may be slower after a brain injury. Never do these activities if you are dizzy.  Ask your health care provider about when you can return to work or school. Preventing Another Concussion It is very important to avoid another brain injury, especially before you have recovered. In rare cases, another injury can lead to permanent brain damage, brain swelling, or death. The risk of this is greatest during the first 7-10 days after a head injury. Avoid injuries by:  Wearing a  seat belt when riding in a car.  Drinking alcohol only in moderation.  Wearing a helmet when biking, skiing, skateboarding, skating, or doing similar activities.  Avoiding activities that could lead to a second concussion, such as contact or recreational sports, until your health care provider says it is okay.  Taking safety measures in your home.  Remove clutter and tripping hazards from floors and stairways.  Use grab bars in bathrooms and handrails by stairs.  Place non-slip mats on floors and in bathtubs.  Improve lighting in dim areas. SEEK MEDICAL CARE IF:  You have increased problems paying attention or  concentrating.  You have increased difficulty remembering or learning new information.  You need more time to complete tasks or assignments than before.  You have increased irritability or decreased ability to cope with stress.  You have more symptoms than before. Seek medical care if you have any of the following symptoms for more than 2 weeks after your injury:  Lasting (chronic) headaches.  Dizziness or balance problems.  Nausea.  Vision problems.  Increased sensitivity to noise or light.  Depression or mood swings.  Anxiety or irritability.  Memory problems.  Difficulty concentrating or paying attention.  Sleep problems.  Feeling tired all the time. SEEK IMMEDIATE MEDICAL CARE IF:  You have severe or worsening headaches. These may be a sign of a blood clot in the brain.  You have weakness (even if only in one hand, leg, or part of the face).  You have numbness.  You have decreased coordination.  You vomit repeatedly.  You have increased sleepiness.  One pupil is larger than the other.  You have convulsions.  You have slurred speech.  You have increased confusion. This may be a sign of a blood clot in the brain.  You have increased restlessness, agitation, or irritability.  You are unable to recognize people or places.  You have neck pain.  It is difficult to wake you up.  You have unusual behavior changes.  You lose consciousness. MAKE SURE YOU:  Understand these instructions.  Will watch your condition.  Will get help right away if you are not doing well or get worse.   This information is not intended to replace advice given to you by your health care provider. Make sure you discuss any questions you have with your health care provider.   Document Released: 08/27/2003 Document Revised: 06/27/2014 Document Reviewed: 12/27/2012 Elsevier Interactive Patient Education 2016 [employer]  Cervical Sprain A cervical sprain is an injury  in the neck in which the strong, fibrous tissues (ligaments) that connect your neck bones stretch or tear. Cervical sprains can range from mild to severe. Severe cervical sprains can cause the neck vertebrae to be unstable. This can lead to damage of the spinal cord and can result in serious nervous system problems. The amount of time it takes for a cervical sprain to get better depends on the cause and extent of the injury. Most cervical sprains heal in 1 to 3 weeks. CAUSES  Severe cervical sprains may be caused by:   Contact sport injuries (such as from football, rugby, wrestling, hockey, auto racing, gymnastics, diving, martial arts, or boxing).   Motor vehicle collisions.   Whiplash injuries. This is an injury from a sudden forward and backward whipping movement of the head and neck.  Falls.  Mild cervical sprains may be caused by:   Being in an awkward position, such as while cradling a telephone between your ear and  shoulder.   Sitting in a chair that does not offer proper support.   Working at a poorly Landscape architect station.   Looking up or down for long periods of time.  SYMPTOMS   Pain, soreness, stiffness, or a burning sensation in the front, back, or sides of the neck. This discomfort may develop immediately after the injury or slowly, 24 hours or more after the injury.   Pain or tenderness directly in the middle of the back of the neck.   Shoulder or upper back pain.   Limited ability to move the neck.   Headache.   Dizziness.   Weakness, numbness, or tingling in the hands or arms.   Muscle spasms.   Difficulty swallowing or chewing.   Tenderness and swelling of the neck.  DIAGNOSIS  Most of the time your health care provider can diagnose a cervical sprain by taking your history and doing a physical exam. Your health care provider will ask about previous neck injuries and any known neck problems, such as arthritis in the neck. X-rays may be  taken to find out if there are any other problems, such as with the bones of the neck. Other tests, such as a CT scan or MRI, may also be needed.  TREATMENT  Treatment depends on the severity of the cervical sprain. Mild sprains can be treated with rest, keeping the neck in place (immobilization), and pain medicines. Severe cervical sprains are immediately immobilized. Further treatment is done to help with pain, muscle spasms, and other symptoms and may include:  Medicines, such as pain relievers, numbing medicines, or muscle relaxants.   Physical therapy. This may involve stretching exercises, strengthening exercises, and posture training. Exercises and improved posture can help stabilize the neck, strengthen muscles, and help stop symptoms from returning.  HOME CARE INSTRUCTIONS   Put ice on the injured area.   Put ice in a plastic bag.   Place a towel between your skin and the bag.   Leave the ice on for 15-20 minutes, 3-4 times a day.   If your injury was severe, you may have been given a cervical collar to wear. A cervical collar is a two-piece collar designed to keep your neck from moving while it heals.  Do not remove the collar unless instructed by your health care provider.  If you have long hair, keep it outside of the collar.  Ask your health care provider before making any adjustments to your collar. Minor adjustments may be required over time to improve comfort and reduce pressure on your chin or on the back of your head.  Ifyou are allowed to remove the collar for cleaning or bathing, follow your health care provider's instructions on how to do so safely.  Keep your collar clean by wiping it with mild soap and water and drying it completely. If the collar you have been given includes removable pads, remove them every 1-2 days and hand wash them with soap and water. Allow them to air dry. They should be completely dry before you wear them in the collar.  If you are  allowed to remove the collar for cleaning and bathing, wash and dry the skin of your neck. Check your skin for irritation or sores. If you see any, tell your health care provider.  Do not drive while wearing the collar.   Only take over-the-counter or prescription medicines for pain, discomfort, or fever as directed by your health care provider.   Keep all follow-up appointments  as directed by your health care provider.   Keep all physical therapy appointments as directed by your health care provider.   Make any needed adjustments to your workstation to promote good posture.   Avoid positions and activities that make your symptoms worse.   Warm up and stretch before being active to help prevent problems.  SEEK MEDICAL CARE IF:   Your pain is not controlled with medicine.   You are unable to decrease your pain medicine over time as planned.   Your activity level is not improving as expected.  SEEK IMMEDIATE MEDICAL CARE IF:   You develop any bleeding.  You develop stomach upset.  You have signs of an allergic reaction to your medicine.   Your symptoms get worse.   You develop new, unexplained symptoms.   You have numbness, tingling, weakness, or paralysis in any part of your body.  MAKE SURE YOU:   Understand these instructions.  Will watch your condition.  Will get help right away if you are not doing well or get worse.   This information is not intended to replace advice given to you by your health care provider. Make sure you discuss any questions you have with your health care provider.   Document Released: 04/03/2007 Document Revised: 06/11/2013 Document Reviewed: 12/12/2012 Elsevier Interactive Patient Education [employer].

## 2016-05-09 ENCOUNTER — Telehealth: Payer: Self-pay | Admitting: Family Medicine

## 2016-05-09 NOTE — Telephone Encounter (Signed)
Pt called about recent bill she received for the 9/28 visit. She paid it but has conflicting info from insurance (shows 0 paid) and is requesting a cb to discuss.

## 2016-05-10 NOTE — Telephone Encounter (Signed)
Notified pt the bal due was amt applied to deductible.

## 2016-11-15 ENCOUNTER — Ambulatory Visit: Payer: BLUE CROSS/BLUE SHIELD | Admitting: Family Medicine

## 2016-12-02 ENCOUNTER — Telehealth: Payer: Self-pay | Admitting: Nurse Practitioner

## 2016-12-02 DIAGNOSIS — J069 Acute upper respiratory infection, unspecified: Secondary | ICD-10-CM

## 2016-12-02 MED ORDER — AZITHROMYCIN 250 MG PO TABS: ORAL_TABLET | ORAL | 0 refills | Status: DC

## 2016-12-02 MED ORDER — BENZONATATE 100 MG PO CAPS: 100.0000 mg | ORAL_CAPSULE | Freq: Three times a day (TID) | ORAL | 0 refills | Status: DC | PRN

## 2016-12-02 NOTE — Progress Notes (Signed)

## 2017-02-09 ENCOUNTER — Telehealth: Payer: Self-pay

## 2017-02-09 DIAGNOSIS — D699 Hemorrhagic condition, unspecified: Secondary | ICD-10-CM | POA: Diagnosis not present

## 2017-02-09 DIAGNOSIS — N912 Amenorrhea, unspecified: Secondary | ICD-10-CM | POA: Diagnosis not present

## 2017-02-09 DIAGNOSIS — Z3201 Encounter for pregnancy test, result positive: Secondary | ICD-10-CM | POA: Diagnosis not present

## 2017-02-09 NOTE — Telephone Encounter (Signed)
Pt did home pregnancy test which was positive; in past 2 pregnancies pt was started on progesterone;advised pt we could do pregnancy test but if positive would need to see OB GYN; pt decided to contact her OB GYN and let them confirm pregnancy and discuss need of progesterone. FYI to Dr Diona Browner.

## 2017-03-07 DIAGNOSIS — D219 Benign neoplasm of connective and other soft tissue, unspecified: Secondary | ICD-10-CM | POA: Insufficient documentation

## 2017-03-07 DIAGNOSIS — O3680X0 Pregnancy with inconclusive fetal viability, not applicable or unspecified: Secondary | ICD-10-CM | POA: Diagnosis not present

## 2017-03-07 DIAGNOSIS — Z3689 Encounter for other specified antenatal screening: Secondary | ICD-10-CM | POA: Diagnosis not present

## 2017-03-07 DIAGNOSIS — Z363 Encounter for antenatal screening for malformations: Secondary | ICD-10-CM | POA: Diagnosis not present

## 2017-03-07 DIAGNOSIS — Z3682 Encounter for antenatal screening for nuchal translucency: Secondary | ICD-10-CM | POA: Diagnosis not present

## 2017-03-07 DIAGNOSIS — Z3A01 Less than 8 weeks gestation of pregnancy: Secondary | ICD-10-CM | POA: Diagnosis not present

## 2017-03-20 IMAGING — CR DG TOE GREAT 2+V*R*
1 series · 1 of 1 positions shown · non-contrast
Comparison: None.

CLINICAL DATA: Dropped a pot on right great toe.

EXAM:
RIGHT GREAT TOE

[view not recorded]
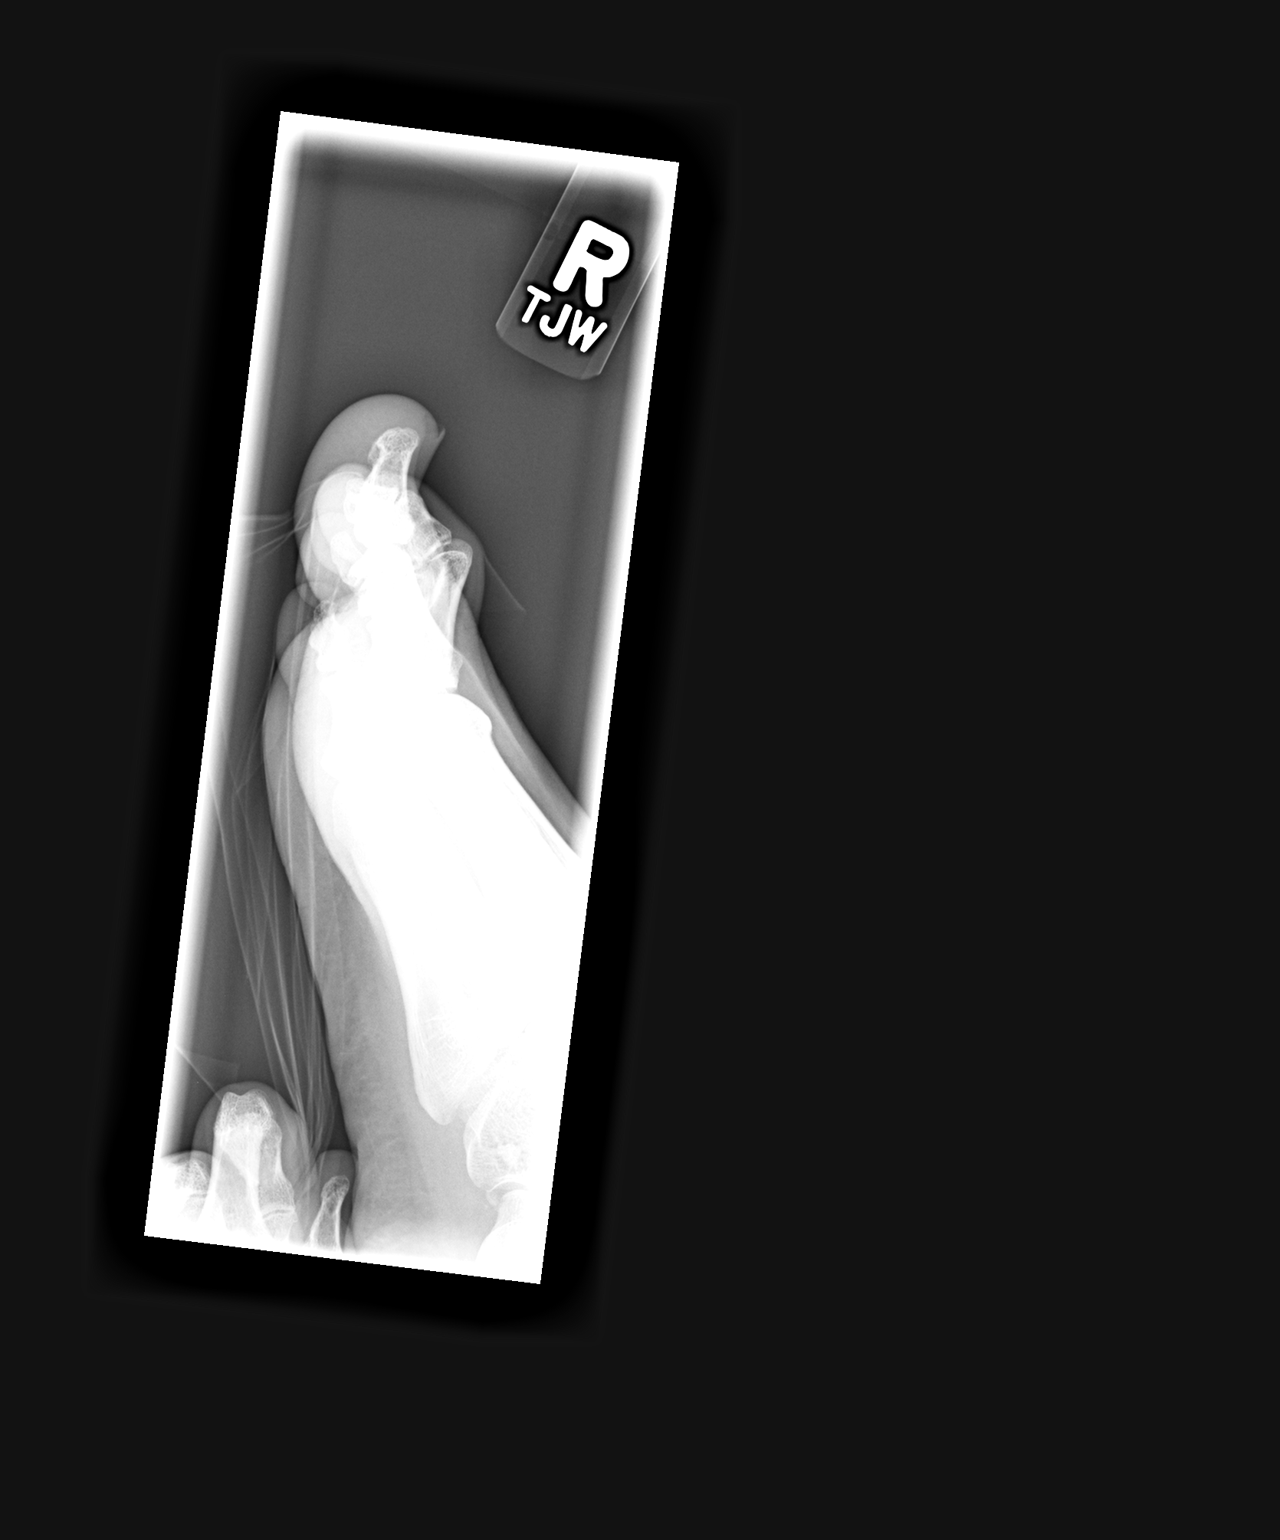

[1 of 1 positions shown; findings below may reference images not displayed]

FINDINGS: No visible fracture. No subluxation or dislocation. Joint spaces are
maintained.
IMPRESSION: No acute bony abnormality visualized.

## 2017-04-12 DIAGNOSIS — O4401 Placenta previa specified as without hemorrhage, first trimester: Secondary | ICD-10-CM | POA: Diagnosis not present

## 2017-04-12 DIAGNOSIS — D699 Hemorrhagic condition, unspecified: Secondary | ICD-10-CM | POA: Diagnosis not present

## 2017-04-12 DIAGNOSIS — Z3689 Encounter for other specified antenatal screening: Secondary | ICD-10-CM | POA: Diagnosis not present

## 2017-04-12 DIAGNOSIS — D219 Benign neoplasm of connective and other soft tissue, unspecified: Secondary | ICD-10-CM | POA: Diagnosis not present

## 2017-04-12 DIAGNOSIS — Z3682 Encounter for antenatal screening for nuchal translucency: Secondary | ICD-10-CM | POA: Diagnosis not present

## 2017-05-15 ENCOUNTER — Telehealth: Payer: Self-pay

## 2017-05-15 NOTE — Telephone Encounter (Signed)
I spoke with pt and she has appt with OB GYN. FYI to Dr Diona Browner.

## 2017-05-15 NOTE — Telephone Encounter (Signed)
Noted  

## 2017-05-15 NOTE — Telephone Encounter (Signed)
PLEASE NOTE: All timestamps contained within this report are represented as [place] Standard Time. CONFIDENTIALTY NOTICE: This fax transmission is intended only for the addressee. It contains information that is legally privileged, confidential or otherwise protected from use or disclosure. If you are not the intended recipient, you are strictly prohibited from reviewing, disclosing, copying using or disseminating any of this information or taking any action in reliance on or regarding this information. If you have received this fax in error, please notify us immediately by telephone so that we can arrange for its return to [place]. Phone: [phone], Toll-Free: [phone], Fax: [phone] Page: 1 of 2 Call Id: [id number] Benjamin Patient Name: Veronica Kane Gender: Female DOB: Aug 25, 1985 Age: 31 Y 66 M 28 D Return Phone Number: [phone] (Primary) Address: City/State/Zip: [place]  [address] Client [facility] - Client Client Site [place] Physician Eliezer Lofts - MD Contact Type Call Who Is Calling Patient / Member / Family / Caregiver Call Type Triage / Clinical Relationship To Patient Self Return Phone Number [phone] (Primary) Chief Complaint Facial Pain Reason for Call Symptomatic / Request for Health Information Initial Comment Caller states is [redacted] wks pregnant, has a sinus infection, Dr's appt on Tues, face is hurting really bad and would like to know what to take. Translation No Nurse Assessment Nurse: Arthor Captain, RN, Margaret Date/Time (Eastern Time): 05/14/2017 9:45:46 AM Confirm and document reason for call. If symptomatic, describe symptoms. ---Caller states that she is having facial pain thinks she has a sinus infection, she is [redacted] weeks pregnant and wants to know what to do. She has an appointment on  Tuesday. Temp is 98.0 AX. Drainage is yellowish green. Symptoms started three days ago. Does the patient have any new or worsening symptoms? ---Yes Will a triage be completed? ---Yes Related visit to physician within the last 2 weeks? ---No Does the PT have any chronic conditions? (i.e. diabetes, asthma, etc.) ---No Is the patient pregnant or possibly pregnant? (Ask all females between the ages of 59-55) ---Yes How many weeks gestation? ---49 What is the estimated delivery date? ---2017-10-22 Total number of pregnancies including current? ---4 Number of live births? ---2 Have you felt decreased fetal movement? ---No Is this a behavioral health or substance abuse call? ---No PLEASE NOTE: All timestamps contained within this report are represented as [place] Standard Time. CONFIDENTIALTY NOTICE: This fax transmission is intended only for the addressee. It contains information that is legally privileged, confidential or otherwise protected from use or disclosure. If you are not the intended recipient, you are strictly prohibited from reviewing, disclosing, copying using or disseminating any of this information or taking any action in reliance on or regarding this information. If you have received this fax in error, please notify us immediately by telephone so that we can arrange for its return to [place]. Phone: [phone], Toll-Free: [phone], Fax: [phone] Page: 2 of 2 Call Id: [id number] Guidelines Guideline Title Affirmed Question Affirmed Notes Nurse Date/Time Eilene Ghazi Time) Sinus Pain or Congestion [1] Sinus congestion (pressure, fullness) AND [2] present > 10 days Cockrum, RN, Joycelyn Schmid 05/14/2017 9:48:53 AM Disp. Time Eilene Ghazi Time) Disposition Final User 05/14/2017 9:53:18 AM See PCP When Office is Open (within 3 days) Yes Cockrum, RN, Jamesetta Geralds Disagree/Comply Comply Caller Understands Yes PreDisposition Call Doctor Care Advice Given Per Guideline SEE PCP WITHIN  3 DAYS: * You need to be seen  within 2 or 3 days. Call your doctor during regular office hours and make an appointment. An urgent care center is often the best source of care if your doctor's office is closed or you can't get an appointment. NOTE: If office will be open tomorrow, tell caller to call then, not in 3 days. FOR A STUFFY NOSE - USE NASAL WASHES: * Introduction: Saline (salt water) nasal irrigation (nasal wash) is an effective and simple home remedy for treating stuffy nose and sinus congestion. The nose can be irrigated by pouring, spraying, or squirting salt water into the nose and then letting it run back out. * Methods: There are several ways to perform nasal irrigation. You can use a saline nasal spray bottle (available over-the-counter), a rubber ear syringe, a medical syringe without the needle, or a NETI POT. STEP-BY-STEP INSTRUCTIONS: * STEP 1: Lean over a sink. * STEP 2: Gently squirt or spray warm salt water into one of your nostrils. * STEP 3: Some of the water may run into the back of your throat. Spit this out. If you swallow the salt water it will not hurt you. * STEP 4: Blow your nose to clean out the water and mucus. PAIN MEDICINES: * For pain relief, take acetaminophen, ibuprofen, or naproxen. * Use the lowest amount that makes your pain feel better. ACETAMINOPHEN (E.G., TYLENOL): * Take 650 mg (two 325 mg pills) by mouth every 4-6 hours as needed. Each Regular Strength Tylenol pill has 325 mg of acetaminophen. The most you should take each day is 3,250 mg (10 Regular Strength pills a day). * Another choice is to take 1,000 mg (two 500 mg pills) every 8 hours as needed. Each Extra Strength Tylenol pill has 500 mg of acetaminophen. The most you should take each day is 3,000 mg (6 Extra Strength pills a day). CALL BACK IF: * Difficulty breathing (and not relieved by cleaning out nose) * You become worse. CARE ADVICE given per Sinus Pain or Congestion (Adult)  guideline. Comments User: Chinita Pester, RN Date/Time Eilene Ghazi Time): 05/14/2017 9:50:15 AM facial pain is a 4/10 on pain scale. Referrals REFERRED TO PCP OFFICE

## 2017-06-05 DIAGNOSIS — Z8349 Family history of other endocrine, nutritional and metabolic diseases: Secondary | ICD-10-CM | POA: Diagnosis not present

## 2017-06-05 DIAGNOSIS — Z3A20 20 weeks gestation of pregnancy: Secondary | ICD-10-CM | POA: Diagnosis not present

## 2017-06-05 DIAGNOSIS — Z363 Encounter for antenatal screening for malformations: Secondary | ICD-10-CM | POA: Diagnosis not present

## 2017-07-31 DIAGNOSIS — Z3689 Encounter for other specified antenatal screening: Secondary | ICD-10-CM | POA: Diagnosis not present

## 2017-07-31 DIAGNOSIS — Z3A28 28 weeks gestation of pregnancy: Secondary | ICD-10-CM | POA: Diagnosis not present

## 2017-08-08 ENCOUNTER — Telehealth: Payer: Self-pay | Admitting: Family Medicine

## 2017-08-08 MED ORDER — OSELTAMIVIR PHOSPHATE 75 MG PO CAPS: 75.0000 mg | ORAL_CAPSULE | Freq: Every day | ORAL | 0 refills | Status: AC

## 2017-08-08 NOTE — Telephone Encounter (Signed)
Daughter with flu.  Pregnant.. Discussed risk vs benefit.. Pt has decided to take tamiflu prevention.

## 2017-08-24 DIAGNOSIS — R350 Frequency of micturition: Secondary | ICD-10-CM | POA: Diagnosis not present

## 2017-08-29 DIAGNOSIS — Z23 Encounter for immunization: Secondary | ICD-10-CM | POA: Diagnosis not present

## 2017-09-26 DIAGNOSIS — Z3A36 36 weeks gestation of pregnancy: Secondary | ICD-10-CM | POA: Diagnosis not present

## 2017-09-26 DIAGNOSIS — Z3689 Encounter for other specified antenatal screening: Secondary | ICD-10-CM | POA: Diagnosis not present

## 2017-10-12 DIAGNOSIS — Z3A39 39 weeks gestation of pregnancy: Secondary | ICD-10-CM | POA: Diagnosis not present

## 2017-10-12 DIAGNOSIS — O365930 Maternal care for other known or suspected poor fetal growth, third trimester, not applicable or unspecified: Secondary | ICD-10-CM | POA: Diagnosis not present

## 2017-10-16 DIAGNOSIS — Z3A39 39 weeks gestation of pregnancy: Secondary | ICD-10-CM | POA: Diagnosis not present

## 2017-10-16 DIAGNOSIS — O99824 Streptococcus B carrier state complicating childbirth: Secondary | ICD-10-CM | POA: Diagnosis not present

## 2017-10-17 DIAGNOSIS — O99824 Streptococcus B carrier state complicating childbirth: Secondary | ICD-10-CM | POA: Diagnosis not present

## 2017-10-18 MED ORDER — MEASLES, MUMPS & RUBELLA VAC ~~LOC~~ INJ
0.50 | INJECTION | SUBCUTANEOUS | Status: DC
Start: ? — End: 2017-10-18

## 2017-10-18 MED ORDER — METHYLERGONOVINE MALEATE 0.2 MG/ML IJ SOLN
200.00 | INTRAMUSCULAR | Status: DC
Start: ? — End: 2017-10-18

## 2017-10-18 MED ORDER — LIDOCAINE HCL (PF) 1 % IJ SOLN
1.00 | INTRAMUSCULAR | Status: DC
Start: ? — End: 2017-10-18

## 2017-10-18 MED ORDER — MISOPROSTOL 100 MCG PO TABS
800.00 | ORAL_TABLET | ORAL | Status: DC
Start: ? — End: 2017-10-18

## 2017-10-18 MED ORDER — GENERIC EXTERNAL MEDICATION
Status: DC
Start: ? — End: 2017-10-18

## 2017-10-18 MED ORDER — CARBOPROST TROMETHAMINE 250 MCG/ML IM SOLN
250.00 | INTRAMUSCULAR | Status: DC
Start: ? — End: 2017-10-18

## 2017-10-18 MED ORDER — GENERIC EXTERNAL MEDICATION
1.00 | Status: DC
Start: ? — End: 2017-10-18

## 2017-10-18 MED ORDER — MAGNESIUM HYDROXIDE 400 MG/5ML PO SUSP
30.00 | ORAL | Status: DC
Start: ? — End: 2017-10-18

## 2017-10-18 MED ORDER — EPHEDRINE SULFATE 50 MG/ML IJ SOLN
5.00 | INTRAMUSCULAR | Status: DC
Start: ? — End: 2017-10-18

## 2017-10-18 MED ORDER — IBUPROFEN 800 MG PO TABS
800.00 | ORAL_TABLET | ORAL | Status: DC
Start: 2017-10-18 — End: 2017-10-18

## 2017-10-18 MED ORDER — BENZOCAINE-MENTHOL 20-0.5 % EX AERO
1.00 | INHALATION_SPRAY | CUTANEOUS | Status: DC
Start: ? — End: 2017-10-18

## 2017-10-18 MED ORDER — HYDROCODONE-ACETAMINOPHEN 5-325 MG PO TABS
1.00 | ORAL_TABLET | ORAL | Status: DC
Start: ? — End: 2017-10-18

## 2017-10-18 MED ORDER — GENERIC EXTERNAL MEDICATION
0.50 | Status: DC
Start: ? — End: 2017-10-18

## 2017-10-18 MED ORDER — BISACODYL 10 MG RE SUPP
10.00 | RECTAL | Status: DC
Start: ? — End: 2017-10-18

## 2017-10-18 MED ORDER — TETANUS-DIPHTH-ACELL PERTUSSIS 5-2.5-18.5 LF-MCG/0.5 IM SUSP
0.50 | INTRAMUSCULAR | Status: DC
Start: ? — End: 2017-10-18

## 2017-10-18 MED ORDER — NALOXONE HCL 0.4 MG/ML IJ SOLN
0.10 | INTRAMUSCULAR | Status: DC
Start: ? — End: 2017-10-18

## 2017-10-18 MED ORDER — GENERIC EXTERNAL MEDICATION
.50 | Status: DC
Start: ? — End: 2017-10-18

## 2017-10-18 MED ORDER — NALBUPHINE HCL 10 MG/ML IJ SOLN
2.50 | INTRAMUSCULAR | Status: DC
Start: ? — End: 2017-10-18

## 2017-10-18 MED ORDER — ALUMINUM-MAGNESIUM-SIMETHICONE 200-200-20 MG/5ML PO SUSP
30.00 | ORAL | Status: DC
Start: ? — End: 2017-10-18

## 2017-10-18 MED ORDER — IRON PO
1.00 | ORAL | Status: DC
Start: 2017-10-19 — End: 2017-10-18

## 2017-10-19 ENCOUNTER — Telehealth: Payer: Self-pay | Admitting: *Deleted

## 2017-10-19 NOTE — Telephone Encounter (Signed)
Copied from [place]. Topic: Appointment Scheduling - Scheduling Inquiry for Clinic >> Oct 18, 2017 10:02 AM Lennox Solders wrote: Reason for CRM: pt just had a newborn and would like to know if dr Diona Browner will accept her baby  >> Oct 18, 2017 11:09 AM Ramond Craver B wrote: Left message asking pt to call office.  Check what time of insurance baby has Dr Diona Browner can see baby 5/3 @ 4 >> Oct 19, 2017  9:03 AM Margot Ables wrote: Mom states the baby has [employer]. She said hospital pediatrician stated the baby has to be seen today. In the hospital they stated billirubin went from 2 to 6-8 w/in 24 hours and her weight dropped 7% w/in 48 hours. Mother is breast feeding. They are trying to prevent baby from coming back to the hospital. Phone # [phone].

## 2017-10-19 NOTE — Telephone Encounter (Signed)
Appointment scheduled for Veronica Kane today at 12:15 pm with Dr. Diona Browner.

## 2017-12-18 DIAGNOSIS — Z130 Encounter for screening for diseases of the blood and blood-forming organs and certain disorders involving the immune mechanism: Secondary | ICD-10-CM | POA: Diagnosis not present

## 2018-04-19 ENCOUNTER — Ambulatory Visit (INDEPENDENT_AMBULATORY_CARE_PROVIDER_SITE_OTHER): Payer: BLUE CROSS/BLUE SHIELD

## 2018-04-19 DIAGNOSIS — Z23 Encounter for immunization: Secondary | ICD-10-CM

## 2018-07-09 DIAGNOSIS — S0501XA Injury of conjunctiva and corneal abrasion without foreign body, right eye, initial encounter: Secondary | ICD-10-CM | POA: Diagnosis not present

## 2018-08-31 ENCOUNTER — Telehealth: Payer: Self-pay

## 2018-08-31 NOTE — Telephone Encounter (Signed)
Really does need appt to be evaluated.. if evisit not available for pt.. let me know and I will reconsider calling in antibiotics for pt .. would still need follow up next week.

## 2018-08-31 NOTE — Telephone Encounter (Signed)
Patient called talked to her lactation consultant and ob/gyn and they called in an antibiotic.

## 2018-08-31 NOTE — Telephone Encounter (Signed)
Noted! Thank you

## 2018-08-31 NOTE — Telephone Encounter (Signed)
Pt has 40 month old and is nursing;lt breast is red and sore but can still nurse from that side but sore; pt has been alternating warm/cold compresses and drinking more fluid and taking Tylenol. Pt cannot come to office for appt due to no babysitter for 3 children. Pt does not have a fever and did not know if she needed abx. [facility]; pt last saw Dr Diona Browner 12/31/15. Pt is going to ck about getting an evisit. Pt request cb after Dr Diona Browner reviews.

## 2018-11-13 DIAGNOSIS — H16041 Marginal corneal ulcer, right eye: Secondary | ICD-10-CM | POA: Diagnosis not present

## 2019-03-15 ENCOUNTER — Ambulatory Visit (INDEPENDENT_AMBULATORY_CARE_PROVIDER_SITE_OTHER): Payer: Self-pay | Admitting: Internal Medicine

## 2019-03-15 ENCOUNTER — Encounter: Payer: Self-pay | Admitting: Internal Medicine

## 2019-03-15 VITALS — Temp 98.6°F

## 2019-03-15 DIAGNOSIS — N61 Mastitis without abscess: Secondary | ICD-10-CM

## 2019-03-15 MED ORDER — SULFAMETHOXAZOLE-TRIMETHOPRIM 800-160 MG PO TABS: 1.0000 | ORAL_TABLET | Freq: Two times a day (BID) | ORAL | 0 refills | Status: DC

## 2019-03-15 NOTE — Patient Instructions (Signed)
Mastitis  Mastitis is irritation and swelling (inflammation) in an area of the breast. It is often caused by an infection that occurs when germs (bacteria) enter the skin. This most often happens to breastfeeding mothers, but it can happen to other women too as well as some men. Follow these instructions at home: Medicines  Take over-the-counter and prescription medicines only as told by your doctor.  If you were prescribed an antibiotic medicine, take it as told by your doctor. Do not stop taking it even if you start to feel better. General instructions  Do not wear a tight or underwire bra. Wear a soft support bra.  Drink more fluids, especially if you have a fever.  Get plenty of rest. If you are breastfeeding:   Keep emptying your breasts by breastfeeding or by using a breast pump.  Keep your nipples clean and dry.  During breastfeeding, empty the first breast before going to the other breast. Use a breast pump if your baby is not emptying your breasts.  Massage your breasts during feeding or pumping as told by your doctor.  If told, put moist heat on the affected area of your breast right before breastfeeding or pumping. Use the heat source that your doctor tells you to use.  If told, put ice on the affected area of your breast right after breastfeeding or pumping: ? Put ice in a plastic bag. ? Place a towel between your skin and the bag. ? Leave the ice on for 20 minutes.  If you go back to work, pump your breasts while at work.  Avoid letting your breasts get overly filled with milk (engorged). Contact a doctor if:  You have pus-like fluid leaking from your breast.  You have a fever.  Your symptoms do not get better within 2 days. Get help right away if:  Your pain and swelling are getting worse.  Your pain is not helped by medicine.  You have a red line going from your breast toward your armpit. Summary  Mastitis is irritation and swelling in an area of  the breast.  If you were prescribed an antibiotic medicine, do not stop taking it even if you start to feel better.  Drink more fluids and get plenty of rest.  Contact a doctor if your symptoms do not get better within 2 days. This information is not intended to replace advice given to you by your health care provider. Make sure you discuss any questions you have with your health care provider. Document Released: 05/25/2009 Document Revised: 05/19/2017 Document Reviewed: 06/28/2016 Elsevier Patient Education  2020 Elsevier Inc.  

## 2019-03-15 NOTE — Progress Notes (Signed)
Virtual Visit via Video Note  I connected with Veronica Kane on 03/15/19 at  3:00 PM EDT by a video enabled telemedicine application and verified that I am speaking with the correct person using two identifiers.  Location: Patient: Home Provider: Office   I discussed the limitations of evaluation and management by telemedicine and the availability of in person appointments. The patient expressed understanding and agreed to proceed.  History of Present Illness:  Pt reports left  breast redness and tenderness. She noticed this last night. She denies discharge from the nipple. She reports fever up to 101.6, had chills but denies body aches. She is nursing her 27 month old. She had mastitis back in March and reports this feel the same.   Past Medical History:  Diagnosis Date  . Allergy   . Anemia   . Asthma    AS A CHILD  . Bleeding diathesis ([place])   . History of constipation   . Pneumonia    AT AGE 57    Current Outpatient Medications  Medication Sig Dispense Refill  . azithromycin (ZITHROMAX Z-PAK) 250 MG tablet As directed 6 tablet 0  . benzonatate (TESSALON PERLES) 100 MG capsule Take 1 capsule (100 mg total) by mouth 3 (three) times daily as needed for cough. 20 capsule 0  . cyclobenzaprine (FLEXERIL) 10 MG tablet Take 1/2 to 1 tablet at bedtime as needed for neck spasm 30 tablet 0  . Docosahexaenoic Acid-EPA (ATABEX DHA) 250-100 MG CAPS Take by mouth.      . Prenatal Vit-Fe Fumarate-FA (PRENATAL ONE DAILY) 27-0.8 MG TABS Take by mouth.       No current facility-administered medications for this visit.     Allergies  Allergen Reactions  . Aspirin Other (See Comments)    Pt ha hx of anemia.  Marland Kitchen Penicillins Hives  . Penicillins Hives  . Guaifenesin Rash    Family History  Problem Relation Age of Onset  . Cancer Other        BREAST CANCER  . Cancer Other 73       BREAST CANCER  . Cancer Mother 65       uterine and ovarian / hysterectomy  . Heart  disease Father   . Hypertension Father   . Alcohol abuse Maternal Grandfather   . Hypertension Paternal Grandmother   . Alcohol abuse Paternal Grandfather     Social History   Socioeconomic History  . Marital status: Unknown    Spouse name: Not on file  . Number of children: Not on file  . Years of education: Not on file  . Highest education level: Not on file  Occupational History  . Not on file  Social Needs  . Financial resource strain: Not on file  . Food insecurity    Worry: Not on file    Inability: Not on file  . Transportation needs    Medical: Not on file    Non-medical: Not on file  Tobacco Use  . Smoking status: Never Smoker  . Smokeless tobacco: Never Used  Substance and Sexual Activity  . Alcohol use: Yes    Alcohol/week: 0.0 standard drinks    Comment: OCCASSION  . Drug use: No  . Sexual activity: Yes    Partners: Male    Birth control/protection: Pill  Lifestyle  . Physical activity    Days per week: Not on file    Minutes per session: Not on file  . Stress: Not on file  Relationships  .  Social Herbalist on phone: Not on file    Gets together: Not on file    Attends religious service: Not on file    Active member of club or organization: Not on file    Attends meetings of clubs or organizations: Not on file    Relationship status: Not on file  . Intimate partner violence    Fear of current or ex partner: Not on file    Emotionally abused: Not on file    Physically abused: Not on file    Forced sexual activity: Not on file  Other Topics Concern  . Not on file  Social History Narrative   ** Merged History Encounter **         Constitutional: Pt reports fever, chills and body aches. Denies malaise, fatigue, headache or abrupt weight changes.  Gastrointestinal: Denies abdominal pain, bloating, constipation, diarrhea or blood in the stool.  Skin: Pt reports breast redness and tenderness. Denies rashes, lesions or ulcercations.     No other specific complaints in a complete review of systems (except as listed in HPI above).  Observations/Objective:   Wt Readings from Last 3 Encounters:  03/17/16 125 lb (56.7 kg)  12/31/15 124 lb 12.8 oz (56.6 kg)  07/14/15 122 lb (55.3 kg)    General: Appears her stated age, w in NAD. Skin: Warm, dry and intact. Redness noted of left breast. Pulmonary/Chest: Normal effort . No respiratory distress.  Neurological: Alert and oriented.    BMET No results found for: NA, K, CL, CO2, GLUCOSE, BUN, CREATININE, CALCIUM, GFRNONAA, GFRAA  Lipid Panel  No results found for: CHOL, TRIG, HDL, CHOLHDL, VLDL, LDLCALC  CBC    Component Value Date/Time   WBC 6.8 05/18/2011 1427   RBC 4.54 05/18/2011 1427   HGB 14.2 05/18/2011 1427   HCT 41.7 05/18/2011 1427   PLT 251 05/18/2011 1427   MCV 91.9 05/18/2011 1427   MCH 31.3 05/18/2011 1427   MCHC 34.1 05/18/2011 1427   RDW 12.7 05/18/2011 1427   LYMPHSABS 1.4 05/18/2011 1427   MONOABS 0.4 05/18/2011 1427   EOSABS 0.0 05/18/2011 1427   BASOSABS 0.0 05/18/2011 1427    Hgb A1C No results found for: HGBA1C     Assessment and Plan:  Mastitis, Left Breast:  Warm compresses, frequent feedings RX for Septra DS 1 tab po BID x 10 days Continue Tylenol prn Red flags discussed  Follow Up Instructions:    I discussed the assessment and treatment plan with the patient. The patient was provided an opportunity to ask questions and all were answered. The patient agreed with the plan and demonstrated an understanding of the instructions.   The patient was advised to call back or seek an in-person evaluation if the symptoms worsen or if the condition fails to improve as anticipated.     Webb Silversmith, NP

## 2019-03-27 ENCOUNTER — Telehealth: Payer: Self-pay

## 2019-03-27 NOTE — Telephone Encounter (Signed)
Pt had virtual visit on 03/15/19 for lt breast mastitis and pt finished abx and all symptoms were resolved. Couple days after finishing abx pt started with rt breast feeling like clogged duct in rt breast this time; bruised feeling or sensation,body aches, nausea,period cramping (pt not on period)that radiates to legs. No red streaks. T 100. Pt wanted to know if needed another rx of abx. [facility]. Pt request cb after Avie Echevaria NP reviews. Pt will use warm compresses,frequent feedings and Tylenol prn.

## 2019-03-28 NOTE — Telephone Encounter (Signed)
I recommend she schedule an appt with PCP to discuss further.

## 2019-04-01 NOTE — Telephone Encounter (Signed)
Left detailed msg on VM per HIPAA  

## 2019-04-30 ENCOUNTER — Encounter: Payer: Self-pay | Admitting: Family Medicine

## 2019-04-30 ENCOUNTER — Ambulatory Visit (INDEPENDENT_AMBULATORY_CARE_PROVIDER_SITE_OTHER): Payer: Self-pay | Admitting: Family Medicine

## 2019-04-30 VITALS — Temp 99.0°F | Ht 64.0 in | Wt 137.0 lb

## 2019-04-30 DIAGNOSIS — Z7189 Other specified counseling: Secondary | ICD-10-CM

## 2019-04-30 DIAGNOSIS — Z3009 Encounter for other general counseling and advice on contraception: Secondary | ICD-10-CM

## 2019-04-30 MED ORDER — NORETHINDRONE 0.35 MG PO TABS: 1.0000 | ORAL_TABLET | Freq: Every day | ORAL | 11 refills | Status: DC

## 2019-04-30 NOTE — Progress Notes (Signed)
VIRTUAL VISIT Due to national recommendations of social distancing due to [place] 19, a virtual visit is felt to be most appropriate for this patient at this time.   I connected with the patient on 04/30/19 at  9:40 AM EST by virtual telehealth platform and verified that I am speaking with the correct person using two identifiers.   I discussed the limitations, risks, security and privacy concerns of performing an evaluation and management service by  virtual telehealth platform and the availability of in person appointments. I also discussed with the patient that there may be a patient responsible charge related to this service. The patient expressed understanding and agreed to proceed.  Patient location: Home Provider Location: [facility] Participants: Veronica Kane and Veronica Kane   Chief Complaint  Patient presents with  . Medication Refill    Birth Control    History of Present Illness:  33 year old female presents for  Medication refill appointment.   Last PAP smear negative in 12/2015.  She is taking Heather ( norethindrone). NO SE but no menses. She is still breast feeding and would like to continue this.  Daughter is 50 month. Plans to come in for CPX when able.. currently family sick.   Children with cold symptoms.Marland Kitchen going for COVID test tommorow. Exposed to a child who has been exposed. She has some  fatigue, mild chest heaviness, no SOB, no cough. No fever in her ( temp 99.42F this AM), daughter with fever.  COVID 19 screen The importance of social distancing was discussed today.   Review of Systems  Constitutional: Negative for chills and fever.  HENT: Negative for congestion and ear pain.   Eyes: Negative for pain and redness.  Respiratory: Negative for cough and shortness of breath.   Cardiovascular: Negative for chest pain, palpitations and leg swelling.  Gastrointestinal: Negative for abdominal pain, blood in stool, constipation, diarrhea,  nausea and vomiting.  Genitourinary: Negative for dysuria.  Musculoskeletal: Negative for falls and myalgias.  Skin: Negative for rash.  Neurological: Negative for dizziness.  Psychiatric/Behavioral: Negative for depression. The patient is not nervous/anxious.       Past Medical History:  Diagnosis Date  . Allergy   . Anemia   . Asthma    AS A CHILD  . Bleeding diathesis ([place])   . History of constipation   . Pneumonia    AT AGE 75    reports that she has never smoked. She has never used smokeless tobacco. She reports current alcohol use. She reports that she does not use drugs.   Current Outpatient Medications:  .  norethindrone (HEATHER) 0.35 MG tablet, TAKE 1 TABLET DAILY, Disp: , Rfl:    Observations/Objective: Temperature 99 F (37.2 C), temperature source Oral, height 5\' 4"  (1.626 m), weight 137 lb (62.1 kg), currently breastfeeding.  Physical Exam  Physical Exam Constitutional:      General: The patient is not in acute distress. Pulmonary:     Effort: Pulmonary effort is normal. No respiratory distress.  Neurological:     Mental Status: The patient is alert and oriented to person, place, and time.  Psychiatric:        Mood and Affect: Mood normal.        Behavior: Behavior normal.   Assessment and Plan  Doing well on Heather.   NO SE.  Also discussed Covid and questions regarding testing.  Pt counseled to have in person exam if chest tightness progresses or SOB. Discussed testing options.  I discussed the assessment and treatment plan with the patient. The patient was provided an opportunity to ask questions and all were answered. The patient agreed with the plan and demonstrated an understanding of the instructions.   The patient was advised to call back or seek an in-person evaluation if the symptoms worsen or if the condition fails to improve as anticipated.     Veronica Lofts, MD

## 2019-07-22 ENCOUNTER — Other Ambulatory Visit: Payer: Self-pay

## 2019-07-22 ENCOUNTER — Ambulatory Visit (INDEPENDENT_AMBULATORY_CARE_PROVIDER_SITE_OTHER): Payer: BC Managed Care – PPO | Admitting: Orthopaedic Surgery

## 2019-07-22 ENCOUNTER — Encounter: Payer: Self-pay | Admitting: Orthopaedic Surgery

## 2019-07-22 ENCOUNTER — Ambulatory Visit: Payer: Self-pay

## 2019-07-22 DIAGNOSIS — M79641 Pain in right hand: Secondary | ICD-10-CM | POA: Diagnosis not present

## 2019-07-22 NOTE — Progress Notes (Signed)
Office Visit Note   Patient: Veronica Kane           Date of Birth: February 11, 1986           MRN: [id number] Visit Date: 07/22/2019              Requested by: Jinny Sanders, MD [place],  [place] [id number] PCP: Jinny Sanders, MD   Assessment & Plan: Visit Diagnoses:  1. Pain in right hand     Plan:  She will apply Voltaren gel to the knee dorsal aspect of the thumb up to 4 times daily.  Also over the A1 pulley nodule at the index finger right hand.  Will obtain EMG nerve conduction studies rule out carpal tunnel as source of her right hand pain and numbness.  Right wrist splint to be worn at night and some during the day.  Follow-up after the studies to go over results and discuss further treatment.  Follow-Up Instructions: Return After EMG nerve conduction studies.   Orders:  Orders Placed This Encounter  Procedures  . XR Hand Complete Right   No orders of the defined types were placed in this encounter.     Procedures: No procedures performed   Clinical Data: No additional findings.   Subjective: Chief Complaint  Patient presents with  . Right Hand - Pain    HPI Veronica Kane is a 34 year old female comes in today with right hand pain has been ongoing for a year and a half that has gotten worse over the last 3 weeks.  She notes nodule over the back of her thumb and also palmar aspect of her hand just proximal to the index finger.  She is also having numbness tingling in the median distribution of the right hand.  Wakes her up.  She is tried heat and cold ibuprofen.  She denies any locking.  Pain does radiate up the arm.  She is right-hand dominant.  She notes that she is unable to hold a cup for long period of time due to numbness tingling.  She states driving makes numbness tingling worse.  She is a mother of 3 and is breast-feeding 80-year-old.  Review of Systems Negative for fevers chills shortness of breath  Objective: Vital Signs:  There were no vitals taken for this visit.  Physical Exam Pulmonary:     Effort: Pulmonary effort is normal.  Neurological:     Mental Status: She is alert and oriented to person, place, and time.  Psychiatric:        Mood and Affect: Mood normal.     Ortho Exam Bilateral hands no rashes skin lesions ulcerations.  Nodule over the [employer] joint area of the dorsal aspect right thumb consistent with a ganglion cyst.  Nodule over the A1 pulley of the index finger no catching or locking.  She has full range of motion bilateral hands.  Positive Tinel's over the right median nerve.  Positive compression over the right median nerve.  Left hand negative Tinel's and compression over the median nerve at the wrist  Specialty Comments:  No specialty comments available.  Imaging: XR Hand Complete Right  Result Date: 07/22/2019 Right hand 3 views: No acute fracture.  No significant arthritic changes no bony abnormalities.  No acute findings    PMFS History: Patient Active Problem List   Diagnosis Date Noted  . Fibroids 03/07/2017  . Allergic rhinitis 01/01/2015  . Bleeding disorder ([place]) 01/01/2015  . Supervision of  normal first pregnancy 05/25/2011  . First trimester bleeding 05/25/2011  . Bleeding diathesis ([place])   . Asthma   . Bleeding disorder ([place]) 02/22/2011   Past Medical History:  Diagnosis Date  . Allergy   . Anemia   . Asthma    AS A CHILD  . Bleeding diathesis ([place])   . History of constipation   . Pneumonia    AT AGE 62    Family History  Problem Relation Age of Onset  . Cancer Other        BREAST CANCER  . Cancer Other 73       BREAST CANCER  . Cancer Mother 58       uterine and ovarian / hysterectomy  . Heart disease Father   . Hypertension Father   . Alcohol abuse Maternal Grandfather   . Hypertension Paternal Grandmother   . Alcohol abuse Paternal Grandfather     Past Surgical History:  Procedure Laterality Date  . TONSILLECTOMY     at ge 4-5  .  TONSILLECTOMY AND ADENOIDECTOMY  1994  . TYMPANOSTOMY TUBE PLACEMENT  1993   X 3  . WISDOM TOOTH EXTRACTION     Social History   Occupational History  . Not on file  Tobacco Use  . Smoking status: Never Smoker  . Smokeless tobacco: Never Used  Substance and Sexual Activity  . Alcohol use: Yes    Alcohol/week: 0.0 standard drinks    Comment: OCCASSION  . Drug use: No  . Sexual activity: Yes    Partners: Male    Birth control/protection: Pill

## 2019-07-22 NOTE — Addendum Note (Signed)
Addended by: Michae Kava B on: 07/22/2019 01:38 PM   Modules accepted: Orders

## 2019-08-07 ENCOUNTER — Encounter: Payer: BC Managed Care – PPO | Admitting: Physical Medicine and Rehabilitation

## 2019-08-22 ENCOUNTER — Encounter: Payer: Self-pay | Admitting: Physical Medicine and Rehabilitation

## 2019-08-22 ENCOUNTER — Other Ambulatory Visit: Payer: Self-pay

## 2019-08-22 ENCOUNTER — Ambulatory Visit: Payer: BC Managed Care – PPO | Admitting: Physical Medicine and Rehabilitation

## 2019-08-22 DIAGNOSIS — R202 Paresthesia of skin: Secondary | ICD-10-CM

## 2019-08-22 NOTE — Progress Notes (Signed)
Veronica Kane - 34 y.o. female MRN [id number]  Date of birth: 24-Nov-1985  Office Visit Note: Visit Date: 08/22/2019 PCP: Jinny Sanders, MD Referred by: Jinny Sanders, MD  Subjective: Chief Complaint  Patient presents with  . Right Hand - Numbness, Tingling   HPI: Veronica Kane is a 34 y.o. female who comes in today At the request of Dr. Jean Rosenthal for electrodiagnostic study of the right upper limb.  Patient reports worsening over the last several weeks of pain in paresthesia feeling in the forearm but also in the hand particularly in the palmar side up to the middle digit.  She reports occasional symptoms of the fifth digit.  She reports symptoms increasing with gripping and holding objects such as a coffee cup.  She gets a lot of pain and paresthesia with sleeping on the right side.  She has been wearing a hand brace that seems to help to some degree as does not using the hand as much.  She is right-hand dominant.  She denies any frank radicular pain but has had some neck pain issues and shoulder myofascial type pain.  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  She has 3 children.  She has not had any prior electrodiagnostic study or cervical surgery or carpal tunnel release.  ROS Otherwise per HPI.  Assessment & Plan: Visit Diagnoses:  1. Paresthesia of skin     Plan: Impression: Essentially NORMAL electrodiagnostic study of the right upper limb.  There is no significant electrodiagnostic evidence of nerve entrapment, brachial plexopathy or cervical radiculopathy.    As you know, purely sensory or demyelinating radiculopathies and chemical radiculitis may not be detected with this particular electrodiagnostic study. **This electrodiagnostic study cannot rule out small fiber polyneuropathy and dysesthesias from central pain syndromes such as stroke or central pain sensitization syndromes such as fibromyalgia.   Myotomal referral pain from trigger points is also not excluded.  Recommendations: 1.  Follow-up with referring physician. 2.  Continue current management of symptoms. 3.  Continue use of resting splint at night-time and as needed during the day. Consider myofascial pain diagnosis and PT referral with dry needling if appropriate.   Meds & Orders: No orders of the defined types were placed in this encounter.   Orders Placed This Encounter  Procedures  . NCV with EMG (electromyography)    Follow-up: Return for Jean Rosenthal, M.D..   Procedures: No procedures performed  EMG & NCV Findings: All nerve conduction studies (as indicated in the following tables) were within normal limits.    All examined muscles (as indicated in the following table) showed no evidence of electrical instability.    Impression: Essentially NORMAL electrodiagnostic study of the right upper limb.  There is no significant electrodiagnostic evidence of nerve entrapment, brachial plexopathy or cervical radiculopathy.    As you know, purely sensory or demyelinating radiculopathies and chemical radiculitis may not be detected with this particular electrodiagnostic study. **This electrodiagnostic study cannot rule out small fiber polyneuropathy and dysesthesias from central pain syndromes such as stroke or central pain sensitization syndromes such as fibromyalgia.  Myotomal referral pain from trigger points is also not excluded.  Recommendations: 1.  Follow-up with referring physician. 2.  Continue current management of symptoms. 3.  Continue use of resting splint at night-time and as needed during the day. Consider myofascial pain diagnosis and PT referral with dry needling if appropriate.  ___________________________ Wonda Olds Board Certified, American  Board of Physical Medicine and Rehabilitation    Nerve Conduction Studies Anti Sensory Summary Table   Stim Site NR Peak (ms) Norm Peak (ms)  P-T Amp (V) Norm P-T Amp Site1 Site2 Delta-P (ms) Dist (cm) Vel (m/s) Norm Vel (m/s)  Right Median Acr Palm Anti Sensory (2nd Digit)  31.7C  Wrist    3.1 <3.6 46.6 >10 Wrist Palm 1.3 0.0    Palm    1.8 <2.0 63.6         Right Radial Anti Sensory (Base 1st Digit)  31.7C  Wrist    2.2 <3.1 32.4  Wrist Base 1st Digit 2.2 0.0    Right Ulnar Anti Sensory (5th Digit)  31.8C  Wrist    3.0 <3.7 34.1 >15.0 Wrist 5th Digit 3.0 14.0 47 >38   Motor Summary Table   Stim Site NR Onset (ms) Norm Onset (ms) O-P Amp (mV) Norm O-P Amp Site1 Site2 Delta-0 (ms) Dist (cm) Vel (m/s) Norm Vel (m/s)  Right Median Motor (Abd Poll Brev)  31.8C  Wrist    3.0 <4.2 7.6 >5 Elbow Wrist 3.4 19.0 56 >50  Elbow    6.4  7.4         Right Ulnar Motor (Abd Dig Min)  32.2C  Wrist    3.0 <4.2 7.6 >3 B Elbow Wrist 2.9 18.0 62 >53  B Elbow    5.9  7.3  A Elbow B Elbow 1.0 9.0 90 >53  A Elbow    6.9  7.5          EMG   Side Muscle Nerve Root Ins Act Fibs Psw Amp Dur Poly Recrt Int Fraser Din Comment  Right Abd Poll Brev Median C8-T1 Nml Nml Nml Nml Nml 0 Nml Nml   Right 1stDorInt Ulnar C8-T1 Nml Nml Nml Nml Nml 0 Nml Nml   Right PronatorTeres Median C6-7 Nml Nml Nml Nml Nml 0 Nml Nml   Right Biceps Musculocut C5-6 Nml Nml Nml Nml Nml 0 Nml Nml     Nerve Conduction Studies Anti Sensory Left/Right Comparison   Stim Site L Lat (ms) R Lat (ms) L-R Lat (ms) L Amp (V) R Amp (V) L-R Amp (%) Site1 Site2 L Vel (m/s) R Vel (m/s) L-R Vel (m/s)  Median Acr Palm Anti Sensory (2nd Digit)  31.7C  Wrist  3.1   46.6  Wrist Palm     Palm  1.8   63.6        Radial Anti Sensory (Base 1st Digit)  31.7C  Wrist  2.2   32.4  Wrist Base 1st Digit     Ulnar Anti Sensory (5th Digit)  31.8C  Wrist  3.0   34.1  Wrist 5th Digit  47    Motor Left/Right Comparison   Stim Site L Lat (ms) R Lat (ms) L-R Lat (ms) L Amp (mV) R Amp (mV) L-R Amp (%) Site1 Site2 L Vel (m/s) R Vel (m/s) L-R Vel (m/s)  Median Motor (Abd Poll Brev)  31.8C  Wrist   3.0   7.6  Elbow Wrist  56   Elbow  6.4   7.4        Ulnar Motor (Abd Dig Min)  32.2C  Wrist  3.0   7.6  B Elbow Wrist  62   B Elbow  5.9   7.3  A Elbow B Elbow  90   A Elbow  6.9   7.5           Waveforms:  Clinical History: No specialty comments available.   She reports that she has never smoked. She has never used smokeless tobacco. No results for input(s): HGBA1C, LABURIC in the last 8760 hours.  Objective:  VS:  HT:    WT:   BMI:     BP:   HR: bpm  TEMP: ( )  RESP:  Physical Exam Musculoskeletal:        General: No swelling, tenderness or deformity.     Comments: Inspection reveals no atrophy of the bilateral APB or FDI or hand intrinsics. There is no swelling, color changes, allodynia or dystrophic changes. There is 5 out of 5 strength in the bilateral wrist extension, finger abduction and long finger flexion. There is intact sensation to light touch in all dermatomal and peripheral nerve distributions.  There is a negative Hoffmann's test bilaterally.  Skin:    General: Skin is warm and dry.     Findings: No erythema or rash.  Neurological:     General: No focal deficit present.     Mental Status: She is alert and oriented to person, place, and time.     Motor: No weakness or abnormal muscle tone.     Coordination: Coordination normal.  Psychiatric:        Mood and Affect: Mood normal.        Behavior: Behavior normal.     Ortho Exam Imaging: No results found.  Past Medical/Family/Surgical/Social History: Medications & Allergies reviewed per EMR, new medications updated. Patient Active Problem List   Diagnosis Date Noted  . Fibroids 03/07/2017  . Allergic rhinitis 01/01/2015  . Bleeding disorder ([place]) 01/01/2015  . Supervision of normal first pregnancy 05/25/2011  . First trimester bleeding 05/25/2011  . Bleeding diathesis ([place])   . Asthma   . Bleeding disorder ([place]) 02/22/2011   Past Medical History:  Diagnosis Date  . Allergy   .  Anemia   . Asthma    AS A CHILD  . Bleeding diathesis ([place])   . History of constipation   . Pneumonia    AT AGE 69   Family History  Problem Relation Age of Onset  . Cancer Other        BREAST CANCER  . Cancer Other 73       BREAST CANCER  . Cancer Mother 32       uterine and ovarian / hysterectomy  . Heart disease Father   . Hypertension Father   . Alcohol abuse Maternal Grandfather   . Hypertension Paternal Grandmother   . Alcohol abuse Paternal Grandfather    Past Surgical History:  Procedure Laterality Date  . TONSILLECTOMY     at ge 4-5  . TONSILLECTOMY AND ADENOIDECTOMY  1994  . TYMPANOSTOMY TUBE PLACEMENT  1993   X 3  . WISDOM TOOTH EXTRACTION     Social History   Occupational History  . Not on file  Tobacco Use  . Smoking status: Never Smoker  . Smokeless tobacco: Never Used  Substance and Sexual Activity  . Alcohol use: Yes    Alcohol/week: 0.0 standard drinks    Comment: OCCASSION  . Drug use: No  . Sexual activity: Yes    Partners: Male    Birth control/protection: Pill

## 2019-08-22 NOTE — Progress Notes (Signed)
 .  Numeric Pain Rating Scale and Functional Assessment Average Pain 2   In the last MONTH (on 0-10 scale) has pain interfered with the following?  1. General activity like being  able to carry out your everyday physical activities such as walking, climbing stairs, carrying groceries, or moving a chair?  Rating(6)     

## 2019-08-22 NOTE — Procedures (Signed)
EMG & NCV Findings: All nerve conduction studies (as indicated in the following tables) were within normal limits.    All examined muscles (as indicated in the following table) showed no evidence of electrical instability.    Impression: Essentially NORMAL electrodiagnostic study of the right upper limb.  There is no significant electrodiagnostic evidence of nerve entrapment, brachial plexopathy or cervical radiculopathy.    As you know, purely sensory or demyelinating radiculopathies and chemical radiculitis may not be detected with this particular electrodiagnostic study. **This electrodiagnostic study cannot rule out small fiber polyneuropathy and dysesthesias from central pain syndromes such as stroke or central pain sensitization syndromes such as fibromyalgia.  Myotomal referral pain from trigger points is also not excluded.  Recommendations: 1.  Follow-up with referring physician. 2.  Continue current management of symptoms. 3.  Continue use of resting splint at night-time and as needed during the day. Consider myofascial pain diagnosis and PT referral with dry needling if appropriate.  ___________________________ Wonda Olds Board Certified, American Board of Physical Medicine and Rehabilitation    Nerve Conduction Studies Anti Sensory Summary Table   Stim Site NR Peak (ms) Norm Peak (ms) P-T Amp (V) Norm P-T Amp Site1 Site2 Delta-P (ms) Dist (cm) Vel (m/s) Norm Vel (m/s)  Right Median Acr Palm Anti Sensory (2nd Digit)  31.7C  Wrist    3.1 <3.6 46.6 >10 Wrist Palm 1.3 0.0    Palm    1.8 <2.0 63.6         Right Radial Anti Sensory (Base 1st Digit)  31.7C  Wrist    2.2 <3.1 32.4  Wrist Base 1st Digit 2.2 0.0    Right Ulnar Anti Sensory (5th Digit)  31.8C  Wrist    3.0 <3.7 34.1 >15.0 Wrist 5th Digit 3.0 14.0 47 >38   Motor Summary Table   Stim Site NR Onset (ms) Norm Onset (ms) O-P Amp (mV) Norm O-P Amp Site1 Site2 Delta-0 (ms) Dist (cm) Vel (m/s) Norm Vel (m/s)    Right Median Motor (Abd Poll Brev)  31.8C  Wrist    3.0 <4.2 7.6 >5 Elbow Wrist 3.4 19.0 56 >50  Elbow    6.4  7.4         Right Ulnar Motor (Abd Dig Min)  32.2C  Wrist    3.0 <4.2 7.6 >3 B Elbow Wrist 2.9 18.0 62 >53  B Elbow    5.9  7.3  A Elbow B Elbow 1.0 9.0 90 >53  A Elbow    6.9  7.5          EMG   Side Muscle Nerve Root Ins Act Fibs Psw Amp Dur Poly Recrt Int Fraser Din Comment  Right Abd Poll Brev Median C8-T1 Nml Nml Nml Nml Nml 0 Nml Nml   Right 1stDorInt Ulnar C8-T1 Nml Nml Nml Nml Nml 0 Nml Nml   Right PronatorTeres Median C6-7 Nml Nml Nml Nml Nml 0 Nml Nml   Right Biceps Musculocut C5-6 Nml Nml Nml Nml Nml 0 Nml Nml     Nerve Conduction Studies Anti Sensory Left/Right Comparison   Stim Site L Lat (ms) R Lat (ms) L-R Lat (ms) L Amp (V) R Amp (V) L-R Amp (%) Site1 Site2 L Vel (m/s) R Vel (m/s) L-R Vel (m/s)  Median Acr Palm Anti Sensory (2nd Digit)  31.7C  Wrist  3.1   46.6  Wrist Palm     Palm  1.8   63.6  Radial Anti Sensory (Base 1st Digit)  31.7C  Wrist  2.2   32.4  Wrist Base 1st Digit     Ulnar Anti Sensory (5th Digit)  31.8C  Wrist  3.0   34.1  Wrist 5th Digit  47    Motor Left/Right Comparison   Stim Site L Lat (ms) R Lat (ms) L-R Lat (ms) L Amp (mV) R Amp (mV) L-R Amp (%) Site1 Site2 L Vel (m/s) R Vel (m/s) L-R Vel (m/s)  Median Motor (Abd Poll Brev)  31.8C  Wrist  3.0   7.6  Elbow Wrist  56   Elbow  6.4   7.4        Ulnar Motor (Abd Dig Min)  32.2C  Wrist  3.0   7.6  B Elbow Wrist  62   B Elbow  5.9   7.3  A Elbow B Elbow  90   A Elbow  6.9   7.5           Waveforms:

## 2019-08-28 ENCOUNTER — Ambulatory Visit: Payer: BC Managed Care – PPO | Admitting: Orthopaedic Surgery

## 2019-09-04 ENCOUNTER — Ambulatory Visit: Payer: BC Managed Care – PPO | Admitting: Orthopaedic Surgery

## 2019-09-11 ENCOUNTER — Ambulatory Visit: Payer: BC Managed Care – PPO | Admitting: Orthopaedic Surgery

## 2019-09-25 ENCOUNTER — Other Ambulatory Visit: Payer: Self-pay

## 2019-09-25 ENCOUNTER — Ambulatory Visit (INDEPENDENT_AMBULATORY_CARE_PROVIDER_SITE_OTHER): Payer: BC Managed Care – PPO | Admitting: Physician Assistant

## 2019-09-25 ENCOUNTER — Encounter: Payer: Self-pay | Admitting: Physician Assistant

## 2019-09-25 DIAGNOSIS — M79641 Pain in right hand: Secondary | ICD-10-CM

## 2019-09-25 NOTE — Progress Notes (Signed)
Office Visit Note   Patient: Veronica Kane           Date of Birth: Aug 12, 1985           MRN: [id number] Visit Date: 09/25/2019              Requested by: Jinny Sanders, MD [place],  [place] [address] PCP: Jinny Sanders, MD   Assessment & Plan: Visit Diagnoses:  1. Pain in right hand     Plan: Due to the fact the patient has improved with using the splint at night would recommend she continue using this.  She can continue using Voltaren gel over the A1 pulley of the index finger.  If she has increased numbness tingling in the hand could definitely try carpal tunnel injection.  She is given cervical spine exercises to perform on her own.  Recommend she take vitamin B6 100 mg twice daily.  She will follow-up with [place] as needed.  Questions were encouraged and answered  Follow-Up Instructions: Return if symptoms worsen or fail to improve.   Orders:  No orders of the defined types were placed in this encounter.  No orders of the defined types were placed in this encounter.     Procedures: No procedures performed   Clinical Data: No additional findings.   Subjective: Chief Complaint  Patient presents with  . Right Hand - Follow-up    HPI [name] returns today to go over the EMG nerve conduction studies to rule out carpal tunnel syndrome right upper extremity.  The EMG nerve conduction studies performed by Dr. Ernestina Patches showed essentially normal electrodiagnostic study of the right upper extremity.  No evidence of nerve entrapment brachial plexopathy or cervical radiculopathy. [name] states that wearing the splint at night has definitely helped.  Her numbness tingling in the right hand is improved.  She still has some bilateral shoulder pain.  She feels this is due to carrying her children.  She has been using the Voltaren gel on her hand over the right index finger A1 pulley region.  She denies any triggering of this finger.  She states the  Voltaren gel is helpful.  Review of Systems See HPI.  Objective: Vital Signs: There were no vitals taken for this visit.  Physical Exam General: Well-developed well-nourished female no acute distress.  Mood and affect appropriate. Ortho Exam Right hand full sensation.  Nodule over the A1 pulley of the index finger but no locking or catching.  Minimal tenderness with palpation over the nodule. Specialty Comments:  No specialty comments available.  Imaging: No results found.   PMFS History: Patient Active Problem List   Diagnosis Date Noted  . Fibroids 03/07/2017  . Allergic rhinitis 01/01/2015  . Bleeding disorder ([place]) 01/01/2015  . Supervision of normal first pregnancy 05/25/2011  . First trimester bleeding 05/25/2011  . Bleeding diathesis ([place])   . Asthma   . Bleeding disorder ([place]) 02/22/2011   Past Medical History:  Diagnosis Date  . Allergy   . Anemia   . Asthma    AS A CHILD  . Bleeding diathesis ([place])   . History of constipation   . Pneumonia    AT AGE 30    Family History  Problem Relation Age of Onset  . Cancer Other        BREAST CANCER  . Cancer Other 73       BREAST CANCER  . Cancer Mother 63  uterine and ovarian / hysterectomy  . Heart disease Father   . Hypertension Father   . Alcohol abuse Maternal Grandfather   . Hypertension Paternal Grandmother   . Alcohol abuse Paternal Grandfather     Past Surgical History:  Procedure Laterality Date  . TONSILLECTOMY     at ge 4-5  . TONSILLECTOMY AND ADENOIDECTOMY  1994  . TYMPANOSTOMY TUBE PLACEMENT  1993   X 3  . WISDOM TOOTH EXTRACTION     Social History   Occupational History  . Not on file  Tobacco Use  . Smoking status: Never Smoker  . Smokeless tobacco: Never Used  Substance and Sexual Activity  . Alcohol use: Yes    Alcohol/week: 0.0 standard drinks    Comment: OCCASSION  . Drug use: No  . Sexual activity: Yes    Partners: Male    Birth control/protection: Pill

## 2019-10-09 DIAGNOSIS — Z20828 Contact with and (suspected) exposure to other viral communicable diseases: Secondary | ICD-10-CM | POA: Diagnosis not present

## 2019-10-09 DIAGNOSIS — Z03818 Encounter for observation for suspected exposure to other biological agents ruled out: Secondary | ICD-10-CM | POA: Diagnosis not present

## 2020-03-30 DIAGNOSIS — Z3490 Encounter for supervision of normal pregnancy, unspecified, unspecified trimester: Secondary | ICD-10-CM | POA: Diagnosis not present

## 2020-03-30 DIAGNOSIS — Z3202 Encounter for pregnancy test, result negative: Secondary | ICD-10-CM | POA: Diagnosis not present

## 2020-04-01 DIAGNOSIS — Z3490 Encounter for supervision of normal pregnancy, unspecified, unspecified trimester: Secondary | ICD-10-CM | POA: Diagnosis not present

## 2020-04-22 DIAGNOSIS — Z3689 Encounter for other specified antenatal screening: Secondary | ICD-10-CM | POA: Diagnosis not present

## 2020-04-22 DIAGNOSIS — O26841 Uterine size-date discrepancy, first trimester: Secondary | ICD-10-CM | POA: Diagnosis not present

## 2020-04-22 DIAGNOSIS — Z1159 Encounter for screening for other viral diseases: Secondary | ICD-10-CM | POA: Diagnosis not present

## 2020-04-22 DIAGNOSIS — O3680X0 Pregnancy with inconclusive fetal viability, not applicable or unspecified: Secondary | ICD-10-CM | POA: Diagnosis not present

## 2020-04-22 DIAGNOSIS — Z113 Encounter for screening for infections with a predominantly sexual mode of transmission: Secondary | ICD-10-CM | POA: Diagnosis not present

## 2020-04-22 DIAGNOSIS — Z3A01 Less than 8 weeks gestation of pregnancy: Secondary | ICD-10-CM | POA: Diagnosis not present

## 2020-04-22 DIAGNOSIS — Z114 Encounter for screening for human immunodeficiency virus [HIV]: Secondary | ICD-10-CM | POA: Diagnosis not present

## 2020-04-23 ENCOUNTER — Telehealth: Payer: Self-pay | Admitting: Family Medicine

## 2020-04-23 NOTE — Telephone Encounter (Signed)
Please schedule CPE with Dr. Bedsole with fasting labs prior. 

## 2020-04-24 NOTE — Telephone Encounter (Signed)
Left message asking pt to call office  °

## 2020-04-26 ENCOUNTER — Other Ambulatory Visit: Payer: Self-pay

## 2020-04-26 ENCOUNTER — Emergency Department: Payer: BC Managed Care – PPO

## 2020-04-26 ENCOUNTER — Encounter: Payer: Self-pay | Admitting: Emergency Medicine

## 2020-04-26 ENCOUNTER — Emergency Department
Admission: EM | Admit: 2020-04-26 | Discharge: 2020-04-26 | Disposition: A | Discharge status: Home / Self Care | Payer: BC Managed Care – PPO | Attending: Emergency Medicine | Admitting: Emergency Medicine

## 2020-04-26 DIAGNOSIS — J45909 Unspecified asthma, uncomplicated: Secondary | ICD-10-CM | POA: Diagnosis not present

## 2020-04-26 DIAGNOSIS — O4690 Antepartum hemorrhage, unspecified, unspecified trimester: Secondary | ICD-10-CM

## 2020-04-26 DIAGNOSIS — Z3A08 8 weeks gestation of pregnancy: Secondary | ICD-10-CM | POA: Insufficient documentation

## 2020-04-26 DIAGNOSIS — O4691 Antepartum hemorrhage, unspecified, first trimester: Secondary | ICD-10-CM | POA: Diagnosis not present

## 2020-04-26 DIAGNOSIS — Z3A01 Less than 8 weeks gestation of pregnancy: Secondary | ICD-10-CM | POA: Diagnosis not present

## 2020-04-26 DIAGNOSIS — O200 Threatened abortion: Secondary | ICD-10-CM | POA: Diagnosis not present

## 2020-04-26 DIAGNOSIS — O209 Hemorrhage in early pregnancy, unspecified: Secondary | ICD-10-CM | POA: Diagnosis not present

## 2020-04-26 LAB — CBC
HCT: 39.2 % (ref 36.0–46.0)
Hemoglobin: 13.6 g/dL (ref 12.0–15.0)
MCH: 31.4 pg (ref 26.0–34.0)
MCHC: 34.7 g/dL (ref 30.0–36.0)
MCV: 90.5 fL (ref 80.0–100.0)
Platelets: 219 10*3/uL (ref 150–400)
RBC: 4.33 MIL/uL (ref 3.87–5.11)
RDW: 12 % (ref 11.5–15.5)
WBC: 8.2 10*3/uL (ref 4.0–10.5)
nRBC: 0 % (ref 0.0–0.2)

## 2020-04-26 LAB — URINALYSIS, ROUTINE W REFLEX MICROSCOPIC
Bacteria, UA: NONE SEEN
Bilirubin Urine: NEGATIVE
Glucose, UA: NEGATIVE mg/dL
Ketones, ur: 20 mg/dL — AB
Leukocytes,Ua: NEGATIVE
Nitrite: NEGATIVE
Protein, ur: NEGATIVE mg/dL
Specific Gravity, Urine: 1.023 (ref 1.005–1.030)
pH: 5 (ref 5.0–8.0)

## 2020-04-26 LAB — BASIC METABOLIC PANEL
Anion gap: 9 (ref 5–15)
BUN: 8 mg/dL (ref 6–20)
CO2: 25 mmol/L (ref 22–32)
Calcium: 9 mg/dL (ref 8.9–10.3)
Chloride: 103 mmol/L (ref 98–111)
Creatinine, Ser: 0.58 mg/dL (ref 0.44–1.00)
GFR, Estimated: >60 mL/min (ref >60)
Glucose, Bld: 114 mg/dL — ABNORMAL HIGH (ref 70–99)
Potassium: 3.6 mmol/L (ref 3.5–5.1)
Sodium: 137 mmol/L (ref 135–145)

## 2020-04-26 LAB — ABO/RH: ABO/RH(D): O POS

## 2020-04-26 LAB — HCG, QUANTITATIVE, PREGNANCY: hCG, Beta Chain, Quant, S: 118976 m[IU]/mL — ABNORMAL HIGH (ref <5)

## 2020-04-26 NOTE — ED Provider Notes (Signed)
[facility] Emergency Department Provider Note  ____________________________________________   First MD Initiated Contact with Patient 04/26/20 1530     (approximate)  I have reviewed the triage vital signs and the nursing notes.   HISTORY  Chief Complaint Vaginal Bleeding    HPI Veronica Kane is a 34 y.o. female presents emergency department. Patient is approximately [redacted] weeks pregnant. She states she had ultrasound at her doctors on November 3 and had a normal IUP with heartbeat. Today she noticed bright red bleeding has become very concerned that she has had a previous miscarriage. She states she has some sharp pains in the left lower quadrant but is not having pain now. States she feels that the bleeding is just spotting.    Past Medical History:  Diagnosis Date  . Allergy   . Anemia   . Asthma    AS A CHILD  . Bleeding diathesis ([place])   . History of constipation   . Pneumonia    AT AGE 45    Patient Active Problem List   Diagnosis Date Noted  . Fibroids 03/07/2017  . Allergic rhinitis 01/01/2015  . Bleeding disorder ([place]) 01/01/2015  . Supervision of normal first pregnancy 05/25/2011  . First trimester bleeding 05/25/2011  . Bleeding diathesis ([place])   . Asthma   . Bleeding disorder ([place]) 02/22/2011    Past Surgical History:  Procedure Laterality Date  . TONSILLECTOMY     at ge 4-5  . TONSILLECTOMY AND ADENOIDECTOMY  1994  . TYMPANOSTOMY TUBE PLACEMENT  1993   X 3  . WISDOM TOOTH EXTRACTION      Prior to Admission medications   Medication Sig Start Date End Date Taking? Authorizing Provider  norethindrone (MICRONOR) 0.35 MG tablet TAKE 1 TABLET BY MOUTH EVERY DAY 04/23/20   Bedsole, Amy E, MD    Allergies Aspirin, Penicillins, Penicillins, and Guaifenesin  Family History  Problem Relation Age of Onset  . Cancer Other        BREAST CANCER  . Cancer Other 73       BREAST CANCER  . Cancer Mother 67        uterine and ovarian / hysterectomy  . Heart disease Father   . Hypertension Father   . Alcohol abuse Maternal Grandfather   . Hypertension Paternal Grandmother   . Alcohol abuse Paternal Grandfather     Social History Social History   Tobacco Use  . Smoking status: Never Smoker  . Smokeless tobacco: Never Used  Substance Use Topics  . Alcohol use: Yes    Alcohol/week: 0.0 standard drinks    Comment: OCCASSION  . Drug use: No    Review of Systems  Constitutional: No fever/chills Eyes: No visual changes. ENT: No sore throat. Respiratory: Denies cough Cardiovascular: Denies chest pain  Gastrointestinal: Positive left lower quadrant abdominal pain Genitourinary: Negative for dysuria. Positive vaginal bleeding Musculoskeletal: Negative for back pain. Skin: Negative for rash. Psychiatric: no mood changes,     ____________________________________________   PHYSICAL EXAM:  VITAL SIGNS: ED Triage Vitals  Enc Vitals Group     BP 04/26/20 1351 124/66     Pulse Rate 04/26/20 1351 87     Resp 04/26/20 1351 18     Temp 04/26/20 1351 98.4 F (36.9 C)     Temp Source 04/26/20 1351 Oral     SpO2 04/26/20 1351 100 %     Weight 04/26/20 1352 145 lb (65.8 kg)     Height 04/26/20  1352 5\' 4"  (1.626 m)     Head Circumference --      Peak Flow --      Pain Score 04/26/20 1351 0     Pain Loc --      Pain Edu? --      Excl. in [place]? --     Constitutional: Alert and oriented. Well appearing and in no acute distress. Eyes: Conjunctivae are normal.  Head: Atraumatic. Nose: No congestion/rhinnorhea. Mouth/Throat: Mucous membranes are moist.   Neck:  supple no lymphadenopathy noted Cardiovascular: Normal rate, regular rhythm. Heart sounds are normal Respiratory: Normal respiratory effort.  No retractions, lungs c t a  Abd: soft nontender bs normal all 4 quad, no rebound tenderness noted GU: deferred Musculoskeletal: FROM all extremities, warm and well perfused Neurologic:   Normal speech and language.  Skin:  Skin is warm, dry and intact. No rash noted. Psychiatric: Mood and affect are normal. Speech and behavior are normal.  ____________________________________________   LABS (all labs ordered are listed, but only abnormal results are displayed)  Labs Reviewed  HCG, QUANTITATIVE, PREGNANCY - Abnormal; Notable for the following components:      Result Value   hCG, Beta Chain, Quant, S [id number] (*)    All other components within normal limits  BASIC METABOLIC PANEL - Abnormal; Notable for the following components:   Glucose, Bld 114 (*)    All other components within normal limits  URINALYSIS, ROUTINE W REFLEX MICROSCOPIC - Abnormal; Notable for the following components:   Color, Urine YELLOW (*)    APPearance CLOUDY (*)    Hgb urine dipstick LARGE (*)    Ketones, ur 20 (*)    All other components within normal limits  CBC  ABO/RH   ____________________________________________   ____________________________________________  RADIOLOGY  Ultrasound OB less than 14 weeks  ____________________________________________   PROCEDURES  Procedure(s) performed: No  Procedures    ____________________________________________   INITIAL IMPRESSION / ASSESSMENT AND PLAN / ED COURSE  Pertinent labs & imaging results that were available during my care of the patient were reviewed by me and considered in my medical decision making (see chart for details).   Patient is a 34 year old female with vaginal bleeding secondary pregnancy. See HPI. Physical exam shows patient to appear stable. Vitals are normal and blood work is reassuring.  DDx: Threatened miscarriage, subchorionic bleed, miscarriage   CBC, basic metabolic panel and urinalysis are all normal. Beta hCG shows 118,976 which would be appropriate approximately 8 weeks. Blood type is O+ therefore she will not need RhoGam  Ultrasound OB less than 14 weeks to rule out miscarriage and determine if  subchorionic bleed  Ultrasound OB less than 14 weeks is normal.  IUP with regular heart rate no subchorionic hemorrhage or hematoma noted.  I did review this myself also.  I did explain the findings to the patient.  She should follow-up with her regular OB/GYN.  Return emergency department if worsening.  Advised pelvic rest.  She states she understands.  She is discharged stable condition.  Veronica Kane was evaluated in Emergency Department on 04/26/2020 for the symptoms described in the history of present illness. She was evaluated in the context of the global COVID-19 pandemic, which necessitated consideration that the patient might be at risk for infection with the SARS-CoV-2 virus that causes COVID-19. Institutional protocols and algorithms that pertain to the evaluation of patients at risk for COVID-19 are in a state of rapid change based on information released by regulatory  bodies including the CDC and federal and state organizations. These policies and algorithms were followed during the patient's care in the ED.    As part of my medical decision making, I reviewed the following data within the [place] notes reviewed and incorporated, Labs reviewed , Old chart reviewed, Radiograph reviewed , Notes from prior ED visits and [place] Controlled Substance Database  ____________________________________________   FINAL CLINICAL IMPRESSION(S) / ED DIAGNOSES  Final diagnoses:  Vaginal bleeding in pregnancy  Threatened miscarriage      NEW MEDICATIONS STARTED DURING THIS VISIT:  Discharge Medication List as of 04/26/2020  5:00 PM       Note:  This document was prepared using Dragon voice recognition software and may include unintentional dictation errors.    Versie Starks, PA-C 04/26/20 Pauline Aus    Lavonia Drafts, MD 04/26/20 1932

## 2020-04-26 NOTE — Discharge Instructions (Addendum)
Follow-up with your OB/GYN.  Please call for an appointment.  If you develop worsening abdominal pain or heavier bleeding you would need to be rechecked immediately.  Return if worsening

## 2020-04-26 NOTE — ED Triage Notes (Signed)
Pt arrived via POV with reports of bright red spotting that started about 1 hour ago, pt also c/o mild L side pain   G-5 P-3 OB is [facility] in [place].

## 2020-05-05 DIAGNOSIS — R7309 Other abnormal glucose: Secondary | ICD-10-CM | POA: Diagnosis not present

## 2020-05-12 DIAGNOSIS — Z3A10 10 weeks gestation of pregnancy: Secondary | ICD-10-CM | POA: Diagnosis not present

## 2020-05-12 DIAGNOSIS — Z3682 Encounter for antenatal screening for nuchal translucency: Secondary | ICD-10-CM | POA: Diagnosis not present

## 2020-05-13 NOTE — Telephone Encounter (Signed)
Spoke with patient said she will call to schedule AWV

## 2020-05-18 DIAGNOSIS — Z363 Encounter for antenatal screening for malformations: Secondary | ICD-10-CM | POA: Diagnosis not present

## 2020-05-18 DIAGNOSIS — Z3A10 10 weeks gestation of pregnancy: Secondary | ICD-10-CM | POA: Diagnosis not present

## 2020-05-27 DIAGNOSIS — Z3682 Encounter for antenatal screening for nuchal translucency: Secondary | ICD-10-CM | POA: Diagnosis not present

## 2020-05-28 NOTE — Telephone Encounter (Signed)
Spoke with patient scheduled CPE and labs 

## 2020-06-27 ENCOUNTER — Telehealth: Payer: Self-pay | Admitting: Family Medicine

## 2020-06-27 DIAGNOSIS — Z13220 Encounter for screening for lipoid disorders: Secondary | ICD-10-CM

## 2020-06-27 NOTE — Telephone Encounter (Signed)
-----   Message from Cloyd Stagers, RT sent at 06/15/2020  2:22 PM EST ----- Regarding: Lab Orders for Tuesday 1.11.2022 Please place lab orders for Tuesday 1.11.2022, office visit for physical on Tuesday 1.18.2022 Thank you, Dyke Maes RT(R)

## 2020-06-30 ENCOUNTER — Other Ambulatory Visit: Payer: BC Managed Care – PPO

## 2020-07-07 ENCOUNTER — Encounter: Payer: BC Managed Care – PPO | Admitting: Family Medicine

## 2020-07-23 DIAGNOSIS — Z363 Encounter for antenatal screening for malformations: Secondary | ICD-10-CM | POA: Diagnosis not present

## 2020-07-23 DIAGNOSIS — Z3A20 20 weeks gestation of pregnancy: Secondary | ICD-10-CM | POA: Diagnosis not present

## 2020-07-23 DIAGNOSIS — Z3689 Encounter for other specified antenatal screening: Secondary | ICD-10-CM | POA: Diagnosis not present

## 2020-07-23 DIAGNOSIS — O350XX0 Maternal care for (suspected) central nervous system malformation in fetus, not applicable or unspecified: Secondary | ICD-10-CM | POA: Diagnosis not present

## 2020-08-20 DIAGNOSIS — Z3A24 24 weeks gestation of pregnancy: Secondary | ICD-10-CM | POA: Diagnosis not present

## 2020-08-20 DIAGNOSIS — Z0379 Encounter for other suspected maternal and fetal conditions ruled out: Secondary | ICD-10-CM | POA: Diagnosis not present

## 2020-09-16 DIAGNOSIS — Z87828 Personal history of other (healed) physical injury and trauma: Secondary | ICD-10-CM | POA: Diagnosis not present

## 2020-09-16 DIAGNOSIS — O0933 Supervision of pregnancy with insufficient antenatal care, third trimester: Secondary | ICD-10-CM | POA: Diagnosis not present

## 2020-09-16 DIAGNOSIS — O3413 Maternal care for benign tumor of corpus uteri, third trimester: Secondary | ICD-10-CM | POA: Diagnosis not present

## 2020-09-16 DIAGNOSIS — O26893 Other specified pregnancy related conditions, third trimester: Secondary | ICD-10-CM | POA: Diagnosis not present

## 2020-09-16 DIAGNOSIS — Z3689 Encounter for other specified antenatal screening: Secondary | ICD-10-CM | POA: Diagnosis not present

## 2020-09-16 DIAGNOSIS — Z8759 Personal history of other complications of pregnancy, childbirth and the puerperium: Secondary | ICD-10-CM | POA: Diagnosis not present

## 2020-09-16 DIAGNOSIS — D259 Leiomyoma of uterus, unspecified: Secondary | ICD-10-CM | POA: Diagnosis not present

## 2020-09-16 DIAGNOSIS — D689 Coagulation defect, unspecified: Secondary | ICD-10-CM | POA: Diagnosis not present

## 2020-09-16 DIAGNOSIS — Z3A28 28 weeks gestation of pregnancy: Secondary | ICD-10-CM | POA: Diagnosis not present

## 2020-10-13 DIAGNOSIS — Z23 Encounter for immunization: Secondary | ICD-10-CM | POA: Diagnosis not present

## 2020-11-10 DIAGNOSIS — Z3689 Encounter for other specified antenatal screening: Secondary | ICD-10-CM | POA: Diagnosis not present

## 2020-11-30 DIAGNOSIS — D689 Coagulation defect, unspecified: Secondary | ICD-10-CM | POA: Diagnosis not present

## 2020-11-30 DIAGNOSIS — Z538 Procedure and treatment not carried out for other reasons: Secondary | ICD-10-CM | POA: Diagnosis not present

## 2020-11-30 DIAGNOSIS — R1084 Generalized abdominal pain: Secondary | ICD-10-CM | POA: Diagnosis not present

## 2020-11-30 DIAGNOSIS — Z888 Allergy status to other drugs, medicaments and biological substances status: Secondary | ICD-10-CM | POA: Diagnosis not present

## 2020-11-30 DIAGNOSIS — Z880 Allergy status to penicillin: Secondary | ICD-10-CM | POA: Diagnosis not present

## 2020-11-30 DIAGNOSIS — Z886 Allergy status to analgesic agent status: Secondary | ICD-10-CM | POA: Diagnosis not present

## 2020-11-30 DIAGNOSIS — O99113 Other diseases of the blood and blood-forming organs and certain disorders involving the immune mechanism complicating pregnancy, third trimester: Secondary | ICD-10-CM | POA: Diagnosis not present

## 2020-11-30 DIAGNOSIS — Z3A39 39 weeks gestation of pregnancy: Secondary | ICD-10-CM | POA: Diagnosis not present

## 2021-02-01 DIAGNOSIS — Z124 Encounter for screening for malignant neoplasm of cervix: Secondary | ICD-10-CM | POA: Diagnosis not present

## 2021-02-01 DIAGNOSIS — Z1151 Encounter for screening for human papillomavirus (HPV): Secondary | ICD-10-CM | POA: Diagnosis not present

## 2021-02-26 ENCOUNTER — Telehealth: Payer: BC Managed Care – PPO | Admitting: Nurse Practitioner

## 2021-02-26 DIAGNOSIS — J029 Acute pharyngitis, unspecified: Secondary | ICD-10-CM | POA: Diagnosis not present

## 2021-02-26 DIAGNOSIS — U071 COVID-19: Secondary | ICD-10-CM

## 2021-02-26 MED ORDER — CEFDINIR 300 MG PO CAPS: 300.0000 mg | ORAL_CAPSULE | Freq: Two times a day (BID) | ORAL | 0 refills | Status: AC

## 2021-02-26 NOTE — Progress Notes (Signed)
Virtual Visit Consent   Veronica Kane, you are scheduled for a virtual visit with Mary-Margaret Hassell Done, [place], a [facility] provider, today.     Just as with appointments in the office, your consent must be obtained to participate.  Your consent will be active for this visit and any virtual visit you may have with one of our providers in the next 365 days.     If you have a MyChart account, a copy of this consent can be sent to you electronically.  All virtual visits are billed to your insurance company just like a traditional visit in the office.    As this is a virtual visit, video technology does not allow for your provider to perform a traditional examination.  This may limit your provider's ability to fully assess your condition.  If your provider identifies any concerns that need to be evaluated in person or the need to arrange testing (such as labs, EKG, etc.), we will make arrangements to do so.     Although advances in technology are sophisticated, we cannot ensure that it will always work on either your end or our end.  If the connection with a video visit is poor, the visit may have to be switched to a telephone visit.  With either a video or telephone visit, we are not always able to ensure that we have a secure connection.     I need to obtain your verbal consent now.   Are you willing to proceed with your visit today? YES   Veronica Kane has provided verbal consent on 02/26/2021 for a virtual visit (video or telephone).   Mary-Margaret Hassell Done, FNP   Date: 02/26/2021 10:55 AM   Virtual Visit via Video Note   I, Mary-Margaret Hassell Done, connected with Veronica Kane ([id number], Oct 05, 1985) on 02/26/21 at 11:00 AM EDT by a video-enabled telemedicine application and verified that I am speaking with the correct person using two identifiers.  Location: Patient: Virtual Visit Location Patient: Home Provider: Virtual Visit Location Provider:  Mobile   I discussed the limitations of evaluation and management by telemedicine and the availability of in person appointments. The patient expressed understanding and agreed to proceed.    History of Present Illness: Veronica Kane is a 35 y.o. who identifies as a female who was assigned female at birth, and is being seen today for covid positive.  HPI:  Patient states taht 4 days ago she developed chills , fever, bidy aches and sore throat. She has been just using OTC meds. Feels bad today. Has a 64 month old baby. She is breast feeding. Tested positie 3 days ago.the main thing bothering her is sore throat and lymph nodes palpable on left side of neck. Problems:  Patient Active Problem List   Diagnosis Date Noted   Fibroids 03/07/2017   Allergic rhinitis 01/01/2015   Bleeding disorder ([place]) 01/01/2015   Supervision of normal first pregnancy 05/25/2011   First trimester bleeding 05/25/2011   Bleeding diathesis ([place])    Asthma    Bleeding disorder ([place]) 02/22/2011    Allergies:  Allergies  Allergen Reactions   Aspirin Other (See Comments)    Pt ha hx of anemia.   Penicillins Hives   Penicillins Hives   Guaifenesin Rash   Medications:  Current Outpatient Medications:    norethindrone (MICRONOR) 0.35 MG tablet, TAKE 1 TABLET BY MOUTH EVERY DAY, Disp: 84 tablet, Rfl: 0  Observations/Objective: Patient is well-developed, well-nourished in no acute distress.  Resting comfortably  at home.  Head is normocephalic, atraumatic.  No labored breathing.  Speech is clear and coherent with logical content.  Patient is alert and oriented at baseline.  Difficulty swallowing  Assessment and Plan:  Veronica Kane in today with chief complaint of No chief complaint on file.   1. COVID-19 virus RNA test result positive at limit of detection Cannot do paxlovid or molnupivir due to breast feeding  2. Pharyngitis, unspecified etiology Force fluids Pop sicles  or ice cream Motrin or tylenol OTC Antibiotic prescribe which may not help. If doe snot then is viral and will just have to treat symptoms.     Follow Up Instructions: I discussed the assessment and treatment plan with the patient. The patient was provided an opportunity to ask questions and all were answered. The patient agreed with the plan and demonstrated an understanding of the instructions.  A copy of instructions were sent to the patient via MyChart.  The patient was advised to call back or seek an in-person evaluation if the symptoms worsen or if the condition fails to improve as anticipated.  Time:  I spent 10 minutes with the patient via telehealth technology discussing the above problems/concerns.    Mary-Margaret Hassell Done, FNP

## 2021-06-02 ENCOUNTER — Other Ambulatory Visit: Payer: Self-pay | Admitting: Family Medicine

## 2021-06-25 ENCOUNTER — Telehealth: Payer: Self-pay | Admitting: Orthopaedic Surgery

## 2021-06-25 NOTE — Telephone Encounter (Signed)
Called patient and scheduled her for this coming Monday with Artis Delay

## 2021-06-25 NOTE — Telephone Encounter (Signed)
Patient called asked if she can be moved up to a sooner appointment because her left arm and wrist are worse. Patient said she has been picking up her child and may have caused more damage. Patient said she has bruising on top and bottom of her arm. The number to contact patient is [phone]

## 2021-06-28 ENCOUNTER — Ambulatory Visit (INDEPENDENT_AMBULATORY_CARE_PROVIDER_SITE_OTHER): Payer: BC Managed Care – PPO | Admitting: Physician Assistant

## 2021-06-28 ENCOUNTER — Other Ambulatory Visit: Payer: Self-pay

## 2021-06-28 ENCOUNTER — Ambulatory Visit: Payer: Self-pay

## 2021-06-28 ENCOUNTER — Encounter: Payer: Self-pay | Admitting: Physician Assistant

## 2021-06-28 DIAGNOSIS — M654 Radial styloid tenosynovitis [de Quervain]: Secondary | ICD-10-CM | POA: Diagnosis not present

## 2021-06-28 MED ORDER — LIDOCAINE HCL 1 % IJ SOLN
1.0000 mL | INTRAMUSCULAR | Status: AC | PRN
  Administered 2021-06-28: 1 mL

## 2021-06-28 NOTE — Progress Notes (Addendum)
Office Visit Note   Patient: Veronica Kane           Date of Birth: [birth year]           MRN: [id number] Visit Date: 06/28/2021              Requested by: Jinny Sanders, MD [place],  [place] [id number] PCP: Jinny Sanders, MD   Assessment & Plan: Visit Diagnoses:  1. De Quervain's syndrome (tenosynovitis)     Plan: She will obtaina thumb spica splint , patient was shown brace here in the office today wishes to obtain it elsewhere.  She will wear this whenever she is up during the day.  She will try to work mostly with her right arm.  She will apply Voltaren gel over the first extensor compartment 2 g 4 times daily.  Follow-up with [place] if pain persist or becomes worse.  Questions were encouraged and answered at length today.  Patient tolerated injection well.  Follow-Up Instructions: Return if symptoms worsen or fail to improve.   Orders:  Orders Placed This Encounter  Procedures   Hand/UE Inj   XR Wrist Complete Left   XR Forearm Left   No orders of the defined types were placed in this encounter.     Procedures: Hand/UE Inj: L extensor compartment 1 for de Quervain's tenosynovitis on 06/28/2021 11:20 AM Medications: 1 mL lidocaine 1 % Aspirate: 1 mL     Clinical Data: No additional findings.   Subjective: Chief Complaint  Patient presents with   Left Wrist - Pain   Left Forearm - Pain    HPI Patient returns today when left wrist pain.  She has been seen in the past for carpal tunnel syndrome.  EMG nerve conduction studies done in March 2021 was normal.  If she is wearing splint and found this beneficial in the past.  She is having mainly sensation of her right wrist locking up and significant pain she points to the first extensor compartment of the left wrist.  Also notes some bruising noted to the left forearm.  She is unsure if she is had particular injury to the arm.  She recently did have a child and has been doing a lot of  lifting.  She states she is having carpal tunnel like syndrome symptoms but with further discussion she is really having minimal numbness tingling in the median nerve distribution most of her pain is in the wrist area.  She notes turning objects or lifting her newborn causes her significant pain.  She is tried her carpal tunnel wrist splint she states that this actually aggravates her wrist.  Patient is nondiabetic.  Review of Systems Negative for fevers or chills.  No recent vaccines.  Objective: Vital Signs: There were no vitals taken for this visit.  Physical Exam General well-developed well-nourished female no acute distress. Psych alert and oriented x3 Ortho Exam Bilateral hands full sensation.  No rashes skin lesions ulcerations that throughout both hands and forearms.  Full motor both hands.  Radial pulses are 2+ bilaterally and equal and symmetric.  Negative grind test of the thumb bilaterally.  Positive Finkelstein's on the left.  She has exquisite tenderness with palpation over the first extensor compartment left hand. Specialty Comments:  No specialty comments available.  Imaging: XR Wrist Complete Left  Result Date: 06/28/2021 Left wrist 3 views: Wrist joint is well located.  There is no evidence of arthropathy involving the wrist  over the carpal bones.  CMC joints well-maintained.  No acute fractures.  XR Forearm Left  Result Date: 06/28/2021 Left forearm 2 views: No acute fractures bony abnormalities.  Wrist and elbow joints well-maintained and located.    PMFS History: Patient Active Problem List   Diagnosis Date Noted   Fibroids 03/07/2017   Allergic rhinitis 01/01/2015   Bleeding disorder ([place]) 01/01/2015   Supervision of normal first pregnancy 05/25/2011   First trimester bleeding 05/25/2011   Bleeding diathesis ([place])    Asthma    Bleeding disorder ([place]) 02/22/2011   Past Medical History:  Diagnosis Date   Allergy    Anemia    Asthma    AS A CHILD    Bleeding diathesis ([place])    History of constipation    Pneumonia    AT AGE 16    Family History  Problem Relation Age of Onset   Cancer Other        BREAST CANCER   Cancer Other 43       BREAST CANCER   Cancer Mother 63       uterine and ovarian / hysterectomy   Heart disease Father    Hypertension Father    Alcohol abuse Maternal Grandfather    Hypertension Paternal Grandmother    Alcohol abuse Paternal Grandfather     Past Surgical History:  Procedure Laterality Date   TONSILLECTOMY     at ge 4-5   TONSILLECTOMY AND ADENOIDECTOMY  1994   TYMPANOSTOMY TUBE PLACEMENT  1993   X 3   WISDOM TOOTH EXTRACTION     Social History   Occupational History   Not on file  Tobacco Use   Smoking status: Never   Smokeless tobacco: Never  Substance and Sexual Activity   Alcohol use: Yes    Alcohol/week: 0.0 standard drinks    Comment: OCCASSION   Drug use: No   Sexual activity: Yes    Partners: Male    Birth control/protection: Pill

## 2021-07-07 ENCOUNTER — Ambulatory Visit: Payer: BC Managed Care – PPO | Admitting: Orthopaedic Surgery

## 2021-10-10 IMAGING — US US OB COMP LESS 14 WK
1 series · 14 of 28 positions shown · non-contrast
Comparison: None.

CLINICAL DATA: Vaginal bleed

EXAM:
OBSTETRIC <14 WK ULTRASOUND
TECHNIQUE: Transabdominal ultrasound was performed for evaluation of the
gestation as well as the maternal uterus and adnexal regions.

[Series 1: us ob comp less 14 wks · 14 of 49 slices shown]
[im 2/49]
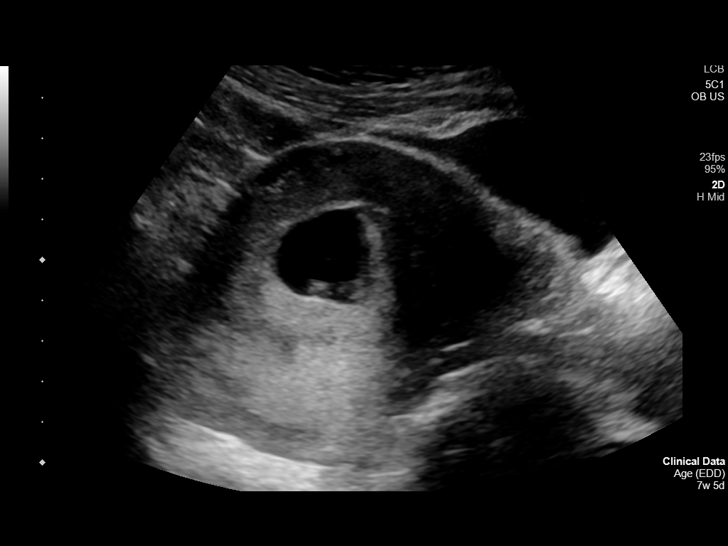
[im 6/49]
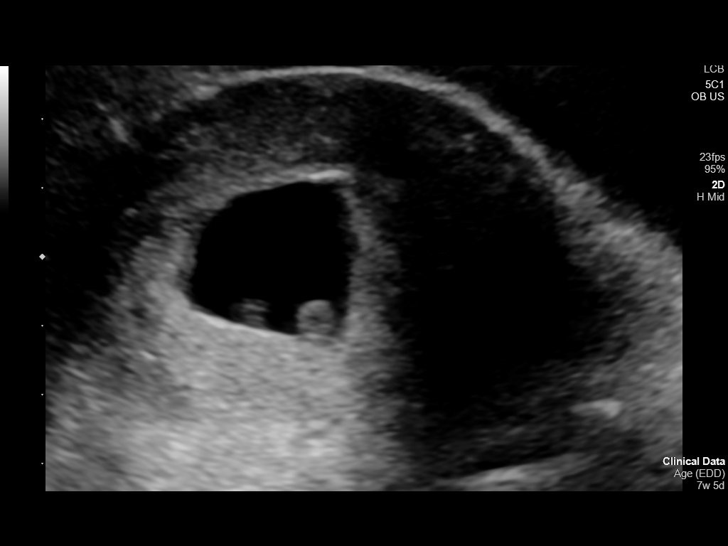
[im 9/49]
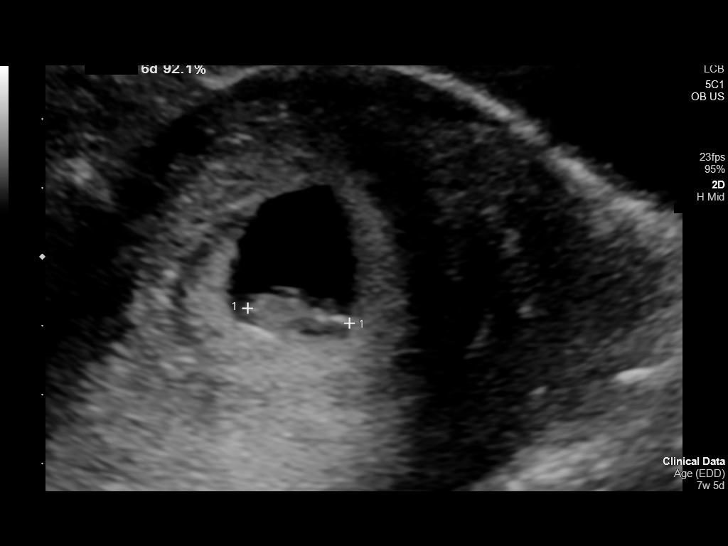
[im 13/49]
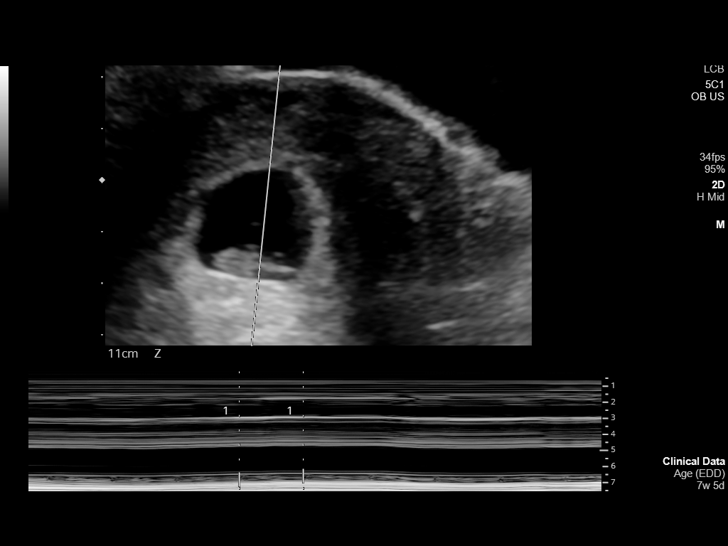
[im 17/49]
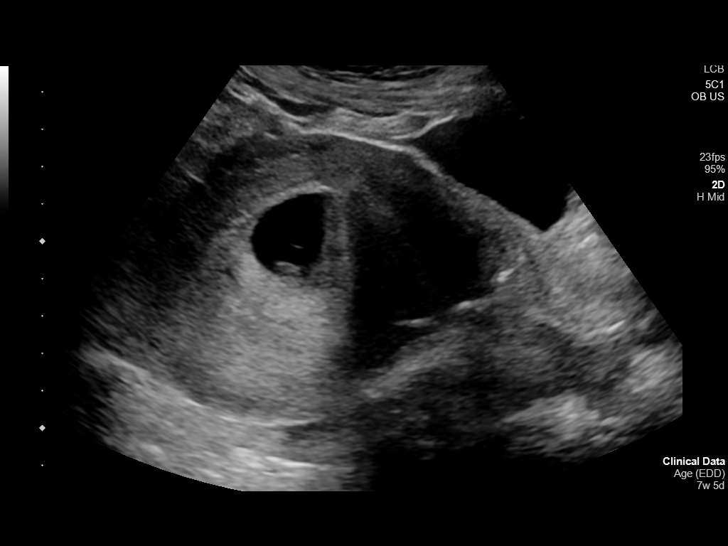
[im 20/49]
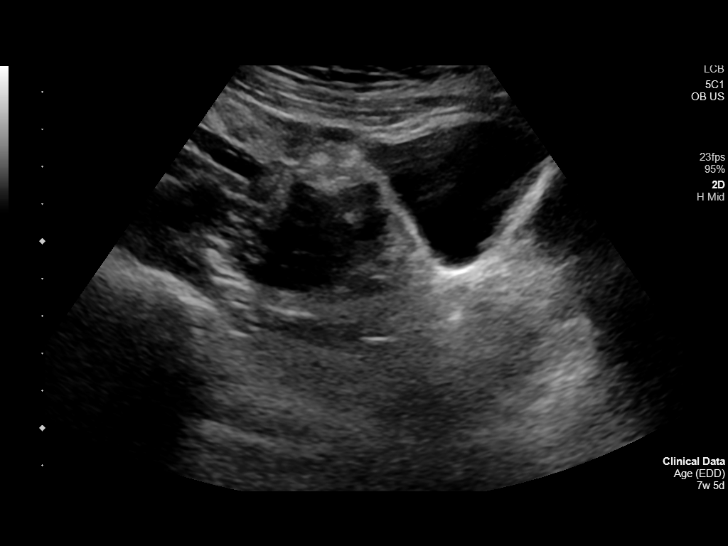
[im 24/49]
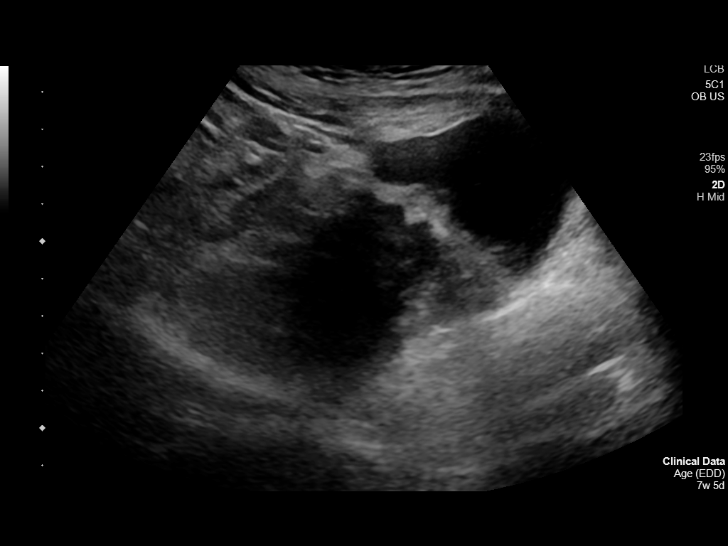
[im 27/49]
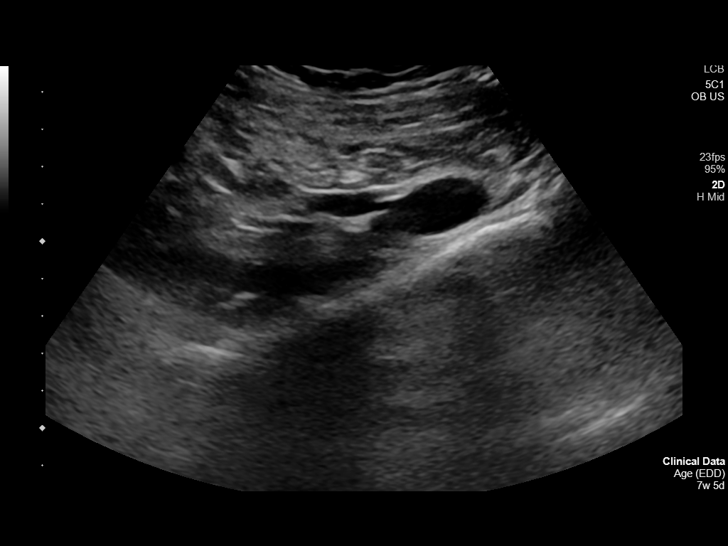
[im 31/49]
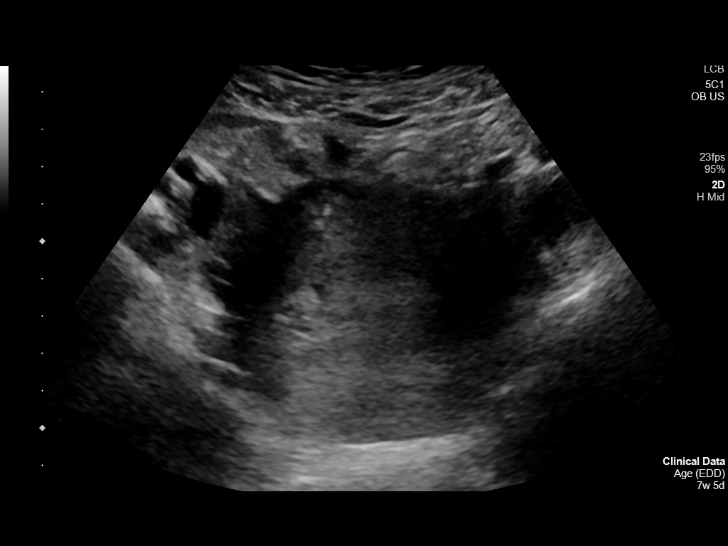
[im 34/49]
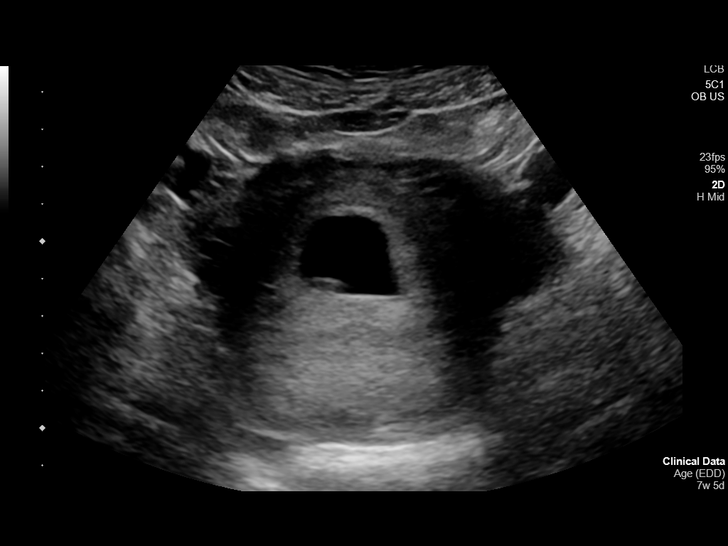
[im 38/49]
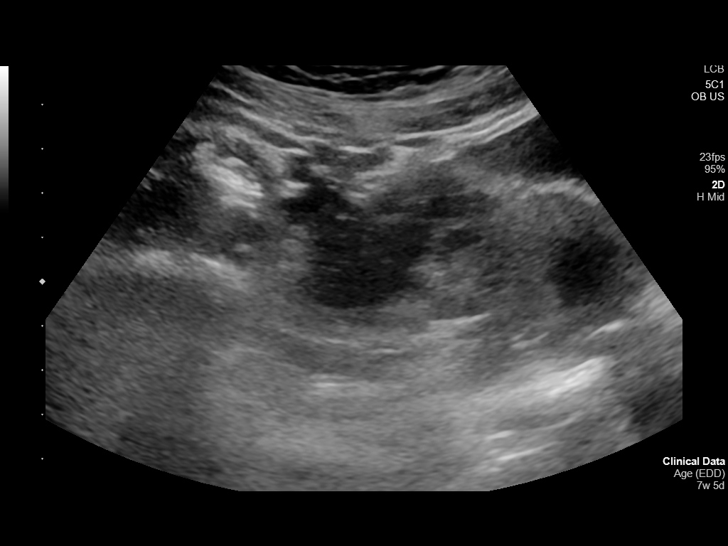
[im 41/49]
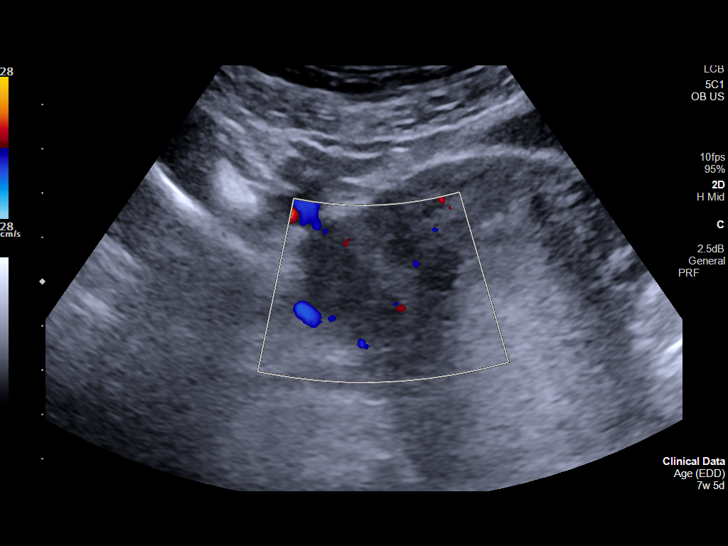
[im 45/49]
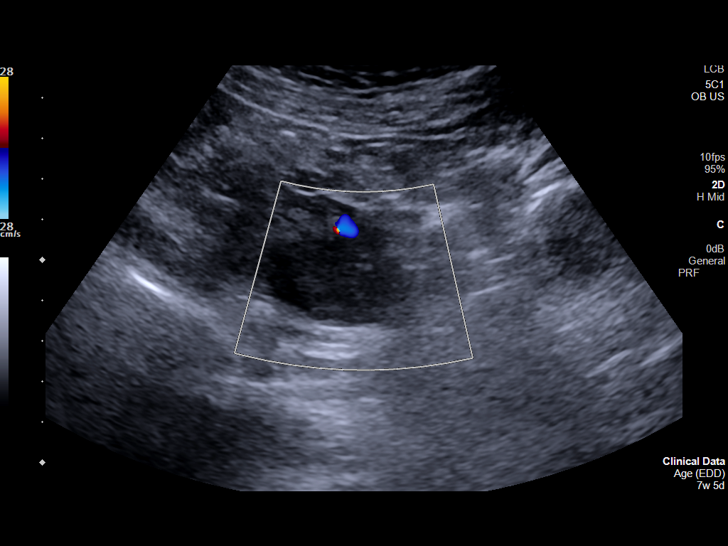
[im 49/49]
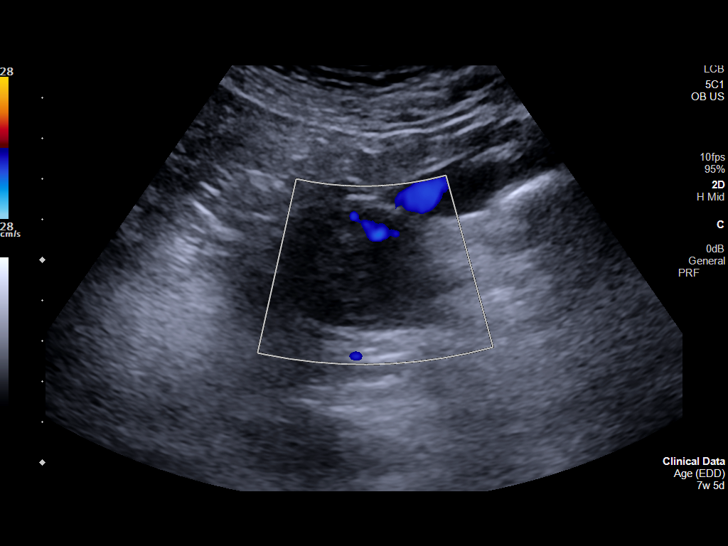

[14 of 28 positions shown; findings below may reference images not displayed]

FINDINGS: Intrauterine gestational sac: Single

Yolk sac:  Visualized.

Embryo:  Visualized.

Cardiac Activity: Visualized

Heart Rate: 160 bpm

CRL:   15.0 mm   7 w 6 d                  US EDC: 12/07/2020

Subchorionic hemorrhage:  None visualized.

Maternal uterus/adnexae: Normal appearing ovaries.
IMPRESSION: Single live intrauterine pregnancy measuring 7 weeks 6 days.

## 2022-08-23 DIAGNOSIS — S46911A Strain of unspecified muscle, fascia and tendon at shoulder and upper arm level, right arm, initial encounter: Secondary | ICD-10-CM | POA: Diagnosis not present

## 2022-08-24 ENCOUNTER — Ambulatory Visit: Payer: BC Managed Care – PPO | Admitting: Orthopaedic Surgery
